# Patient Record
Sex: Female | Born: 1955 | Race: White | Hispanic: No | Smoking: Former smoker
Health system: Southern US, Community
[De-identification: ages and names within clinical notes are randomized; demographics above are authoritative.]

## PROBLEM LIST (undated history)

## (undated) DIAGNOSIS — G259 Extrapyramidal and movement disorder, unspecified: Secondary | ICD-10-CM

## (undated) DIAGNOSIS — H539 Unspecified visual disturbance: Secondary | ICD-10-CM

## (undated) DIAGNOSIS — G35 Multiple sclerosis: Secondary | ICD-10-CM

## (undated) HISTORY — DX: Extrapyramidal and movement disorder, unspecified: G25.9

## (undated) HISTORY — PX: CHOLECYSTECTOMY, LAPAROSCOPIC: SHX56

## (undated) HISTORY — DX: Unspecified visual disturbance: H53.9

## (undated) HISTORY — PX: CERVICAL CONE BIOPSY: SUR198

## (undated) HISTORY — PX: OTHER SURGICAL HISTORY: SHX169

## (undated) HISTORY — DX: Multiple sclerosis: G35

---

## 2013-07-07 DIAGNOSIS — F339 Major depressive disorder, recurrent, unspecified: Secondary | ICD-10-CM | POA: Insufficient documentation

## 2014-05-01 ENCOUNTER — Ambulatory Visit (INDEPENDENT_AMBULATORY_CARE_PROVIDER_SITE_OTHER): Payer: Medicare Other | Admitting: Neurology

## 2014-05-01 ENCOUNTER — Encounter: Payer: Self-pay | Admitting: Neurology

## 2014-05-01 VITALS — BP 134/80 | HR 76 | Resp 14 | Ht 63.0 in | Wt 193.0 lb

## 2014-05-01 DIAGNOSIS — N3941 Urge incontinence: Secondary | ICD-10-CM | POA: Diagnosis not present

## 2014-05-01 DIAGNOSIS — R5383 Other fatigue: Secondary | ICD-10-CM

## 2014-05-01 DIAGNOSIS — E559 Vitamin D deficiency, unspecified: Secondary | ICD-10-CM | POA: Insufficient documentation

## 2014-05-01 DIAGNOSIS — Z79899 Other long term (current) drug therapy: Secondary | ICD-10-CM | POA: Diagnosis not present

## 2014-05-01 DIAGNOSIS — R208 Other disturbances of skin sensation: Secondary | ICD-10-CM

## 2014-05-01 DIAGNOSIS — F418 Other specified anxiety disorders: Secondary | ICD-10-CM | POA: Diagnosis not present

## 2014-05-01 DIAGNOSIS — R269 Unspecified abnormalities of gait and mobility: Secondary | ICD-10-CM | POA: Diagnosis not present

## 2014-05-01 DIAGNOSIS — G35 Multiple sclerosis: Secondary | ICD-10-CM | POA: Diagnosis not present

## 2014-05-01 DIAGNOSIS — R32 Unspecified urinary incontinence: Secondary | ICD-10-CM | POA: Insufficient documentation

## 2014-05-01 DIAGNOSIS — G4719 Other hypersomnia: Secondary | ICD-10-CM | POA: Diagnosis not present

## 2014-05-01 MED ORDER — PHENTERMINE HCL 37.5 MG PO CAPS: 37.5000 mg | ORAL_CAPSULE | ORAL | Status: DC

## 2014-05-01 MED ORDER — AMANTADINE HCL 100 MG PO CAPS: 100.0000 mg | ORAL_CAPSULE | Freq: Two times a day (BID) | ORAL | Status: DC

## 2014-05-01 MED ORDER — CLONAZEPAM 0.5 MG PO TABS: 0.5000 mg | ORAL_TABLET | Freq: Two times a day (BID) | ORAL | Status: DC | PRN

## 2014-05-01 MED ORDER — PAROXETINE HCL 40 MG PO TABS: 40.0000 mg | ORAL_TABLET | ORAL | Status: DC

## 2014-05-01 MED ORDER — PREGABALIN 150 MG PO CAPS: 150.0000 mg | ORAL_CAPSULE | Freq: Two times a day (BID) | ORAL | Status: DC

## 2014-05-01 NOTE — Progress Notes (Signed)
[facility]  PATIENT: Jill Rogers DOB: 06-May-1955  REFERRING DOCTOR OR PCP:  None SOURCE: patient  _________________________________   HISTORICAL  CHIEF COMPLAINT:  Chief Complaint  Patient presents with  . Multiple Sclerosis    Sts. she continues to take Rebif--sts. inj. sites are taking longer to heal.  She ran out of Amantadine, so fatigue is worse.  She fell asleep sitting in a chair last week, had numbness in both legs when she woke up--sts. numbness in left leg resolved quickly, but she still has some numbness in left leg./fim    HISTORY OF PRESENT ILLNESS:  Jill Rogers is a 59 year old woman with multiple sclerosis diagnosed in 1983 after she presented with right hand weakness and a limp.   She had an MRI consistent with MS and she did not need to have a LP.    Those symptoms improved after a few weeks.     She was initially placed on Symmetrel for her fatigue and received some steroids (oral or IV) several times.    She was started on Avonex in 1994.   She switched to Rebif 5 years ago after an exacerbation treated with IV solu-medrol.    Also during that time, her husband left and she was under a lot of stress.    She started seeing me a couple years ago.    She had an episode with poor gait (and falls) and weakness in her legs 2 years ago, treated with Solu-Medrol and then started seeing me.    She tolerates Rebif well but has had some bruising at times.   Most shots just have redness x 1 week but the past month, many are bruising.   She has talked ot the Rebif nurse in the past.   She self-injects rather than using auto-injector.        On Wednesday, while sitting in front of the TV, she fell asleep.  She woke up and her legs felt numb.    Her left leg quickly improved but the right leg would not move.   She had to pick her leg up.  She fell asleep again and she was stronger but not baseline and she has a mild tingling in the leg and it doe snot feel right,  still.  She doesn't feel she can drive a long distance right now.     She also has tingling in the left side of her face and the right forehead.     Gait/Strength/sensation:   She continues to have a mild gait disorder, with difficulty with balance more than strength.  She falls now and then, twice the past month.    She feels her right leg is worse with mild weakness and some spasticity.   Sensory problems in the right leg are more recent, starting last week with tingling in the thigh (inside) currently.      Vision:   She denies any definite MS related vision problem but has a lot of difficulty with depth perception.   She wears glasses.    She has not had optic neuritis but did have a one day episode of diplopia last month.   She denies eye pain.      Bladder:   She has urinary frequency and urgency and has had incontinence 3 times this month.   She reports mild hesitancy at times and feels she does not completely empty.     Fatigue/Sleep:   She notes fatigue and sleepiness.  Fatigue is daily and worsens as day goes on.  Fatigue is physical more than cognitive but mental fatigue bothers her at times.  Heat and fever worsen her energy.     She feels better after a nap.     Sleepiness worsened the last 1 or 2 years.   We have done a sleep study and she does not have OSA.    She gets a benefit from Amantadine and a further benefit from Dexedrine 10 mg that she takes only if she is going to be out of the house a while.      Mood/Cognition:    She notes mood is variable and she has some depression and anxiety.   Many days she is optimistic bu't other days she feels nothing will go right.    Anxiety is often an issue for her.    She is on Paxil, Venlafaxine daily and clonazepam 0.5 mg as needed.   We discussed simplifying her med's --- venlafaxine 150 mg by itself was not effective enough.  EPWORTH SLEEPINESS SCALE  On a scale of 0 - 3 what is the chance of dozing:  Sitting and Reading:   3 Watching  TV:    3 Sitting inactive in a public place: 1 Passenger in car for one hour: 3 Lying down to rest in the afternoon: 3 Sitting and talking to someone: 1 Sitting quietly after lunch:  3 In a car, stopped in traffic:  1  Total (out of 24):    18/24   REVIEW OF SYSTEMS: Constitutional: No fevers, chills, sweats, or change in appetite.   Fatigue.   Excessive sleepines Eyes: No visual changes, double vision, eye pain Ear, nose and throat: No hearing loss, ear pain, nasal congestion, sore throat Cardiovascular: No chest pain, palpitations Respiratory: No shortness of breath at rest or with exertion.   No wheezes GastrointestinaI: No nausea, vomiting, diarrhea, abdominal pain, fecal incontinence Genitourinary:see above Musculoskeletal: Mild neck pain, back pain Integumentary: No rash, pruritus, skin lesions Neurological: as above Psychiatric: see above Endocrine: No palpitations, diaphoresis, change in appetite, change in weigh or increased thirst Hematologic/Lymphatic: No anemia, petechiae.   Bruises easily at injeciton sites Allergic/Immunologic: No itchy/runny eyes, nasal congestion, recent allergic reactions, rashes  ALLERGIES: Allergies  Allergen Reactions  . Hydrocodone Nausea And Vomiting  . Oxycodone Nausea And Vomiting  . Tape     bandaids rip her skin/fim    HOME MEDICATIONS:  Current outpatient prescriptions:  .  acidophilus (RISAQUAD) CAPS capsule, Take 1 capsule by mouth daily., Disp: , Rfl:  .  amantadine (SYMMETREL) 100 MG capsule, Take 100 mg by mouth 2 (two) times daily., Disp: , Rfl:  .  aspirin 81 MG tablet, Take 81 mg by mouth daily., Disp: , Rfl:  .  B Complex-Folic Acid (SUPER B COMPLEX MAXI PO), Take by mouth., Disp: , Rfl:  .  Biotin 5000 MCG CAPS, Take 5,000 mcg by mouth 2 (two) times daily at 8 am and 10 pm., Disp: , Rfl:  .  Chromium 1000 MCG TABS, Take 1,000 tablets by mouth., Disp: , Rfl:  .  clonazePAM (KLONOPIN) 0.5 MG tablet, Take 0.5 mg by  mouth 2 (two) times daily as needed for anxiety., Disp: , Rfl:  .  co-enzyme Q-10 30 MG capsule, Take 30 mg by mouth 3 (three) times daily., Disp: , Rfl:  .  dextroamphetamine (DEXTROSTAT) 10 MG tablet, Take 10 mg by mouth daily. Take one to two tablets once daily as needed.,  Disp: , Rfl:  .  interferon beta-1a (REBIF) 44 MCG/0.5ML SOSY injection, Inject 44 mcg into the skin 3 (three) times a week., Disp: , Rfl:  .  Iron-Vitamins (GERITOL COMPLETE) TABS, Take by mouth daily., Disp: , Rfl:  .  PARoxetine (PAXIL) 20 MG tablet, Take 20 mg by mouth daily., Disp: , Rfl:  .  pregabalin (LYRICA) 150 MG capsule, Take 150 mg by mouth 2 (two) times daily., Disp: , Rfl:  .  venlafaxine XR (EFFEXOR-XR) 150 MG 24 hr capsule, Take 150 mg by mouth daily with breakfast., Disp: , Rfl:  .  vitamin C (ASCORBIC ACID) 500 MG tablet, Take 500 mg by mouth daily., Disp: , Rfl:  .  Vitamin D, Cholecalciferol, 1000 UNITS CAPS, Take 2,000 Units by mouth daily., Disp: , Rfl:   PAST MEDICAL HISTORY: Past Medical History  Diagnosis Date  . Multiple sclerosis   . Movement disorder   . Vision abnormalities     PAST SURGICAL HISTORY: Past Surgical History  Procedure Laterality Date  . Cholecystectomy, laparoscopic    . Cervical cone biopsy      FAMILY HISTORY: Family History  Problem Relation Age of Onset  . Diabetes type II Mother   . Arthritis Mother   . Heart failure Father   . Diabetes type II Father     SOCIAL HISTORY:  History   Social History  . Marital Status: Divorced    Spouse Name: N/A  . Number of Children: N/A  . Years of Education: N/A   Occupational History  . Not on file.   Social History Main Topics  . Smoking status: Current Every Day Smoker -- 0.50 packs/day    Types: Cigarettes  . Smokeless tobacco: Not on file  . Alcohol Use: 0.0 oz/week    0 Standard drinks or equivalent per week     Comment: occasional/fim  . Drug Use: No  . Sexual Activity: Not on file   Other Topics  Concern  . Not on file   Social History Narrative  . No narrative on file     PHYSICAL EXAM  Filed Vitals:   05/01/14 1040  BP: 134/80  Pulse: 76  Resp: 14  Height:  (1.6 m)  Weight: 193 lb (87.544 kg)    Body mass index is 34.2 kg/(m^2).   General: The patient is well-developed and well-nourished and in no acute distress  Eyes:  Funduscopic exam shows normal optic discs and retinal vessels.  Neck: The neck is supple, no carotid bruits are noted.  The neck is nontender.  Cardiovascular: The heart has a regular rate and rhythm with a normal S1 and S2. There were no murmurs, gallops or rubs. Lungs are clear to auscultation.  Skin: Extremities are without significant edema.  Musculoskeletal:  Back is nontender  Neurologic Exam  Mental status: The patient is alert and oriented x 3 at the time of the examination. The patient has apparent normal recent and remote memory, with an apparently normal attention span and concentration ability.   Speech is normal.  Cranial nerves: Extraocular movements are full. Pupils are equal, round, and reactive to light and accomodation.  Visual fields are full.  Facial symmetry is present. There is good facial sensation to soft touch bilaterally.Facial strength is normal.  Trapezius and sternocleidomastoid strength is normal. No dysarthria is noted.  The tongue is midline, and the patient has symmetric elevation of the soft palate. No obvious hearing deficits are noted.  Motor:  Muscle bulk  is normal.   Tone is slightly increased in right leg. Strength is  5 / 5 in all 4 extremities.   Sensory: Sensory testing is intact to pinprick, soft touch and vibration sensation in all 4 extremities in arms but decreased touch/scratch in right anterolateral thigh  Coordination: Cerebellar testing reveals good finger-nose-finger and poor right heel-to-shin .  Gait and station: Station is normal.   Gait is wide. She cannot tandem walk. Romberg is  negative.   Reflexes: Deep tendon reflexes are increased in right leg.   Plantar responses are flexor.    DIAGNOSTIC DATA (LABS, IMAGING, TESTING) - I reviewed patient records, labs, notes, testing and imaging myself where available.     ASSESSMENT AND PLAN  Multiple sclerosis - Plan: MR Brain W Wo Contrast, CBC with Differential/Platelet, Comprehensive metabolic panel  Other fatigue  Depression with anxiety  Dysesthesia  Vitamin D deficiency  High risk medication use - Plan: CBC with Differential/Platelet, Comprehensive metabolic panel  Excessive daytime sleepiness  Gait disturbance  Urge incontinence of urine   In summary, Jill Rogers is a 59 year old woman with multiple sclerosis who currently appears to be stable on Rebif therapy. However, she is having some injection site issues with bruising and erythema. She has multiple symptoms related to the MS including fatigue, gait disturbance, urinary incontinence and dysesthesias. She also has depression and anxiety.   We had a long discussion about symptoms management.    We will continue amantadine for fatigue and add phentermine as it may help her fatigue and also help weight loss. She will continue Lyrica for dysesthesias and this was refilled. Change her depression medications by increasing the Paxil and discontinuing the venlafaxine hopefully this simpler regimen will work for her. She will continue Klonopin twice a day as needed. Refills and scripts were provided. If the urinary issues continue to be a problem, I would consider adding a cholinergic agent for her bladder.   I will check a CBC and CMP to make sure that she is not developing any leukopenia or hepatotoxicity from her Rebif therapy. Additionally, an MRI of the brain with and without contrast will be performed to determine if she is having subclinical progression. If present, I would recommend that we switch from Rebif to another agent.  She will return to see me  in 4-6 months or sooner if she has new or worsening neurologic symptoms.  45 minute face-to-face interaction with greater than 50% of the time counseling and coordinating care about her MS, mood and related symptoms.  Orton Capell A. Epimenio Foot, MD, PhD 05/01/2014, 11:00 AM Certified in Neurology, Clinical Neurophysiology, Sleep Medicine, Pain Medicine and Neuroimaging  [facility] [address] [place], [place] [address] [phone]

## 2014-05-02 ENCOUNTER — Telehealth: Payer: Self-pay | Admitting: Neurology

## 2014-05-02 LAB — COMPREHENSIVE METABOLIC PANEL
ALT: 26 IU/L (ref 0–32)
AST: 21 IU/L (ref 0–40)
Albumin/Globulin Ratio: 1.3 (ref 1.1–2.5)
Albumin: 4 g/dL (ref 3.5–5.5)
Alkaline Phosphatase: 71 IU/L (ref 39–117)
BUN/Creatinine Ratio: 24 — ABNORMAL HIGH (ref 9–23)
BUN: 15 mg/dL (ref 6–24)
Bilirubin Total: 0.2 mg/dL (ref 0.0–1.2)
CO2: 24 mmol/L (ref 18–29)
Calcium: 9.8 mg/dL (ref 8.7–10.2)
Chloride: 104 mmol/L (ref 97–108)
Creatinine, Ser: 0.62 mg/dL (ref 0.57–1.00)
GFR calc Af Amer: 117 mL/min/{1.73_m2} (ref >59)
GFR calc non Af Amer: 101 mL/min/{1.73_m2} (ref >59)
Globulin, Total: 3 g/dL (ref 1.5–4.5)
Glucose: 85 mg/dL (ref 65–99)
POTASSIUM: 5.4 mmol/L — AB (ref 3.5–5.2)
SODIUM: 144 mmol/L (ref 134–144)
Total Protein: 7 g/dL (ref 6.0–8.5)

## 2014-05-02 LAB — CBC WITH DIFFERENTIAL/PLATELET
Basophils Absolute: 0 10*3/uL (ref 0.0–0.2)
Basos: 0 %
Eos: 1 %
Eosinophils Absolute: 0.1 10*3/uL (ref 0.0–0.4)
HCT: 39.7 % (ref 34.0–46.6)
Hemoglobin: 13.1 g/dL (ref 11.1–15.9)
Immature Grans (Abs): 0 10*3/uL (ref 0.0–0.1)
Immature Granulocytes: 0 %
LYMPHS ABS: 4 10*3/uL — AB (ref 0.7–3.1)
Lymphs: 45 %
MCH: 30.3 pg (ref 26.6–33.0)
MCHC: 33 g/dL (ref 31.5–35.7)
MCV: 92 fL (ref 79–97)
Monocytes Absolute: 0.5 10*3/uL (ref 0.1–0.9)
Monocytes: 5 %
Neutrophils Absolute: 4.3 10*3/uL (ref 1.4–7.0)
Neutrophils Relative %: 49 %
PLATELETS: 191 10*3/uL (ref 150–379)
RBC: 4.32 x10E6/uL (ref 3.77–5.28)
RDW: 14.6 % (ref 12.3–15.4)
WBC: 8.9 10*3/uL (ref 3.4–10.8)

## 2014-05-02 NOTE — Telephone Encounter (Signed)
[facility] stated someone from the office need to call to answer the questions regarding PA.  Then it will be denied and we could appeal at that time, per patient.  Please call [facility] @ [phone].

## 2014-05-02 NOTE — Telephone Encounter (Signed)
Patient is calling in regard to one of her new Rx Phentermine 33.5 mg capsules 1 time per day.  Her insurance [employer] does not include Rx in their formulary.  Patient needs either pre-authorization @800 [phone] or a different Rx faxed to [phone] or phone #[phone].  Please call patient.

## 2014-05-02 NOTE — Telephone Encounter (Signed)
Spoke with Jill Rogers and explained that once she takes rx. to the pharmacy, the pharmacy will fax [place] a pa form if one is required--she verbalized understanding of same/fim

## 2014-05-02 NOTE — Telephone Encounter (Signed)
error 

## 2014-05-02 NOTE — Telephone Encounter (Signed)
Ins has been contacted and provided with clinical info.  Request is under review.  Ref Key: [id number]

## 2014-05-06 ENCOUNTER — Ambulatory Visit (HOSPITAL_BASED_OUTPATIENT_CLINIC_OR_DEPARTMENT_OTHER)
Admission: RE | Admit: 2014-05-06 | Discharge: 2014-05-06 | Disposition: A | Discharge status: Home / Self Care | Payer: Medicare Other | Source: Ambulatory Visit | Attending: Neurology | Admitting: Neurology

## 2014-05-06 DIAGNOSIS — R200 Anesthesia of skin: Secondary | ICD-10-CM | POA: Insufficient documentation

## 2014-05-06 DIAGNOSIS — G35 Multiple sclerosis: Secondary | ICD-10-CM | POA: Insufficient documentation

## 2014-05-06 DIAGNOSIS — R270 Ataxia, unspecified: Secondary | ICD-10-CM | POA: Diagnosis not present

## 2014-05-06 MED ORDER — GADOBENATE DIMEGLUMINE 529 MG/ML IV SOLN: 18.0000 mL | Freq: Once | INTRAVENOUS | Status: AC | PRN

## 2014-05-08 ENCOUNTER — Telehealth: Payer: Self-pay

## 2014-05-08 NOTE — Telephone Encounter (Signed)
[facility]) sent a letter saying they are unable to approve the request for coverage on Phentermine, as it is excluded from Medicare coverage by law (under section 1860D-2 (e) (2) of the social security act) and the drug is not offered as a supplemental benefit either.  In order for the patient to obtain medication, they will need to pay out of pocket.  I called the patient to advise.  Got no answer.  Left message.  Recommended she go to [email or web address] to print coupon.  Asked her to call us back if needed.

## 2014-05-08 NOTE — Telephone Encounter (Signed)
-----   Message from Asa Lente, MD sent at 05/08/2014  4:45 PM EDT ----- Please let her know MRI shows no new MS plaques.     Mild sinusitis on the right.   If any recent fevers, let me know and I'll call in an Abx.

## 2014-05-08 NOTE — Telephone Encounter (Signed)
Spoke with Jill Rogers and per RAS, advised no new ms lesions on mri, but she does have mild right sinusitis.  [name] sts. did have more congestion, some fever a few weeks ago, but this is improving./fim

## 2014-05-09 NOTE — Telephone Encounter (Signed)
Patient received message and has questions.  Please return call to [phone].

## 2014-05-09 NOTE — Telephone Encounter (Signed)
I called back.  Got no answer.  Left message.  

## 2014-08-31 ENCOUNTER — Encounter: Payer: Self-pay | Admitting: Neurology

## 2014-08-31 ENCOUNTER — Ambulatory Visit (INDEPENDENT_AMBULATORY_CARE_PROVIDER_SITE_OTHER): Payer: Medicare Other | Admitting: Neurology

## 2014-08-31 VITALS — BP 136/88 | HR 80 | Resp 16 | Ht 63.0 in | Wt 188.8 lb

## 2014-08-31 DIAGNOSIS — R208 Other disturbances of skin sensation: Secondary | ICD-10-CM

## 2014-08-31 DIAGNOSIS — R269 Unspecified abnormalities of gait and mobility: Secondary | ICD-10-CM | POA: Diagnosis not present

## 2014-08-31 DIAGNOSIS — N3946 Mixed incontinence: Secondary | ICD-10-CM | POA: Diagnosis not present

## 2014-08-31 DIAGNOSIS — G35 Multiple sclerosis: Secondary | ICD-10-CM

## 2014-08-31 DIAGNOSIS — F418 Other specified anxiety disorders: Secondary | ICD-10-CM | POA: Diagnosis not present

## 2014-08-31 DIAGNOSIS — Z79899 Other long term (current) drug therapy: Secondary | ICD-10-CM | POA: Diagnosis not present

## 2014-08-31 DIAGNOSIS — G4719 Other hypersomnia: Secondary | ICD-10-CM | POA: Diagnosis not present

## 2014-08-31 MED ORDER — PREGABALIN 150 MG PO CAPS: 150.0000 mg | ORAL_CAPSULE | Freq: Two times a day (BID) | ORAL | Status: DC

## 2014-08-31 MED ORDER — CLONAZEPAM 0.5 MG PO TABS: 0.5000 mg | ORAL_TABLET | Freq: Two times a day (BID) | ORAL | Status: DC | PRN

## 2014-08-31 MED ORDER — PHENTERMINE HCL 37.5 MG PO CAPS: 37.5000 mg | ORAL_CAPSULE | ORAL | Status: DC

## 2014-08-31 NOTE — Progress Notes (Signed)
[facility]  PATIENT: Jill Rogers DOB: 1955/04/17  REFERRING DOCTOR OR PCP:  None SOURCE: patient  _________________________________   HISTORICAL  CHIEF COMPLAINT:  Chief Complaint  Patient presents with  . Multiple Sclerosis    Sts. she continues to tolerate Rebif well.  Continues to have some inj. sites that take longer to heal.  Sts. fatigue is better since restarting Amantadine and adding Phentermine.  Sts. depression is improved since stopping Venlafaxine and increasing Paxil.  Sts. twice in the last few weeks she has had brief episodes of double vision. (sev. min. each episode).  Sts. both episodes were when she was either fatigued or hot.  She had an mri brain in March that didn't show any new changes/fim    HISTORY OF PRESENT ILLNESS:  Jill Rogers is a 59 year old woman with multiple sclerosis.  She continues on Reif and is tolerating it well.    Gait/Strength/sensation:   She has a mild gait disorder, with reduced balance.  She falls about once a month.    She feels her right leg is worse with mild weakness and some spasticity.   She notes numbness in her hands when she wakes.   Numbness resolves after a few minutes if she shakes her hands.    In the past, she had NCV for similar symptoms and was told she did not have CTS.   Last visit, she had an episode of severe right leg numbness and has had one other episode that also lasted 10 minutes.  One episode (lasting 10 minutes) was associated with gardening and occurred after she came in and sat x 30 minutes. Another episode occurred after falling asleep on the couch.  Vision:   She denies any definite MS related vision problem but has needed new glasses almost every year.   She also needs reading glasses   She has not had optic neuritis or eye pain.    She has had 2 episodes of diplopia lasting 5-10 minutes.    Bladder:   She has urinary frequency and urgency and rare incontinence (due to urgency on way to  the bathroom).   She reports mild hesitancy at times and feels she does not completely empty.     Fatigue/Sleep:   She notes physical > cognitive fatigue.   He also notes sleepiness. Both are better since starting daily phentermine.   She takes a dexedrine only on days she cleans her mom's pool (alsways has fatigue doing that).    Heat and fever worsen her fatigue.     She feels sleepiness is better after a nap.    PSG showed she does not have OSA.     Mood/Cognition:    She feels more stress due to financial issues which is casuing her to get anxious more.    She is on Paxil 40 mg but still notes depression.      EPWORTH SLEEPINESS SCALE  On a scale of 0 - 3 what is the chance of dozing:  Sitting and Reading:   1 Watching TV:    2 Sitting inactive in a public place: 1 Passenger in car for one hour: 3 Lying down to rest in the afternoon: 3 Sitting and talking to someone: 1 Sitting quietly after lunch:  1 In a car, stopped in traffic:  0  Total (out of 24):    12/24 (was 18/24 last visit)   MS History:  She was diagnosed in 1983 after she presented with  right hand weakness and a limp.   She had an MRI consistent with MS and she did not need to have a LP.    Those symptoms improved after a few weeks.     She was initially placed on Symmetrel for her fatigue and received some steroids (oral or IV) several times.    She was started on Avonex in 1994.   She switched to Rebif 5 years ago after an exacerbation treated with IV solu-medrol.    Also during that time, her husband left and she was under a lot of stress.    She started seeing me around 2013.  Her last exacerbation was 2 years ago with some gait issues.   She tolerates Rebif well but has had some bruising at times.         REVIEW OF SYSTEMS: Constitutional: No fevers, chills, sweats, or change in appetite.   Fatigue.   Excessive sleepines Eyes: No visual changes, double vision, eye pain Ear, nose and throat: No hearing loss, ear pain,  nasal congestion, sore throat Cardiovascular: No chest pain, palpitations Respiratory: No shortness of breath at rest or with exertion.   No wheezes GastrointestinaI: No nausea, vomiting, diarrhea, abdominal pain, fecal incontinence Genitourinary:see above Musculoskeletal: Mild neck pain, back pain Integumentary: No rash, pruritus, skin lesions Neurological: as above Psychiatric: see above Endocrine: No palpitations, diaphoresis, change in appetite, change in weigh or increased thirst Hematologic/Lymphatic: No anemia, petechiae.   Bruises easily at injeciton sites Allergic/Immunologic: No itchy/runny eyes, nasal congestion, recent allergic reactions, rashes  ALLERGIES: Allergies  Allergen Reactions  . Hydrocodone Nausea And Vomiting  . Oxycodone Nausea And Vomiting  . Tape     bandaids rip her skin/fim    HOME MEDICATIONS:  Current outpatient prescriptions:  .  acidophilus (RISAQUAD) CAPS capsule, Take 1 capsule by mouth daily., Disp: , Rfl:  .  amantadine (SYMMETREL) 100 MG capsule, Take 1 capsule (100 mg total) by mouth 2 (two) times daily., Disp: 180 capsule, Rfl: 3 .  aspirin 81 MG tablet, Take 81 mg by mouth daily., Disp: , Rfl:  .  Biotin 5000 MCG CAPS, Take 5,000 mcg by mouth 2 (two) times daily at 8 am and 10 pm., Disp: , Rfl:  .  Chromium 1000 MCG TABS, Take 1,000 tablets by mouth., Disp: , Rfl:  .  clonazePAM (KLONOPIN) 0.5 MG tablet, Take 1 tablet (0.5 mg total) by mouth 2 (two) times daily as needed for anxiety., Disp: 60 tablet, Rfl: 5 .  co-enzyme Q-10 30 MG capsule, Take 30 mg by mouth 3 (three) times daily., Disp: , Rfl:  .  interferon beta-1a (REBIF) 44 MCG/0.5ML SOSY injection, Inject 44 mcg into the skin 3 (three) times a week., Disp: , Rfl:  .  Iron-Vitamins (GERITOL COMPLETE) TABS, Take by mouth daily., Disp: , Rfl:  .  PARoxetine (PAXIL) 40 MG tablet, Take 1 tablet (40 mg total) by mouth every morning., Disp: 90 tablet, Rfl: 3 .  phentermine 37.5 MG  capsule, Take 1 capsule (37.5 mg total) by mouth every morning., Disp: 90 capsule, Rfl: 1 .  pregabalin (LYRICA) 150 MG capsule, Take 1 capsule (150 mg total) by mouth 2 (two) times daily., Disp: 60 capsule, Rfl: 5 .  Vitamin D, Cholecalciferol, 1000 UNITS CAPS, Take 2,000 Units by mouth daily., Disp: , Rfl:  .  B Complex-Folic Acid (SUPER B COMPLEX MAXI PO), Take by mouth., Disp: , Rfl:  .  dextroamphetamine (DEXTROSTAT) 10 MG tablet, Take 10 mg by  mouth daily. Take one to two tablets once daily as needed., Disp: , Rfl:  .  vitamin C (ASCORBIC ACID) 500 MG tablet, Take 500 mg by mouth daily., Disp: , Rfl:   PAST MEDICAL HISTORY: Past Medical History  Diagnosis Date  . Multiple sclerosis   . Movement disorder   . Vision abnormalities     PAST SURGICAL HISTORY: Past Surgical History  Procedure Laterality Date  . Cholecystectomy, laparoscopic    . Cervical cone biopsy      FAMILY HISTORY: Family History  Problem Relation Age of Onset  . Diabetes type II Mother   . Arthritis Mother   . Heart failure Father   . Diabetes type II Father     SOCIAL HISTORY:  History   Social History  . Marital Status: Divorced    Spouse Name: N/A  . Number of Children: N/A  . Years of Education: N/A   Occupational History  . Not on file.   Social History Main Topics  . Smoking status: Current Every Day Smoker -- 0.50 packs/day    Types: Cigarettes  . Smokeless tobacco: Not on file  . Alcohol Use: 0.0 oz/week    0 Standard drinks or equivalent per week     Comment: occasional/fim  . Drug Use: No  . Sexual Activity: Not on file   Other Topics Concern  . Not on file   Social History Narrative     PHYSICAL EXAM  Filed Vitals:   08/31/14 1047  BP: 136/88  Pulse: 80  Resp: 16  Height: 5\' 3"  (1.6 m)  Weight: 188 lb 12.8 oz (85.639 kg)    Body mass index is 33.45 kg/(m^2).   General: The patient is well-developed and well-nourished and in no acute  distress  Cardiovascular: The heart has a regular rate and rhythm with a normal S1 and S2. There were no murmurs, gallops or rubs. .  Skin: Extremities are without significant edema.  Musculoskeletal:  Back is nontender  Neurologic Exam  Mental status: The patient is alert and oriented x 3 at the time of the examination. The patient has apparent normal recent and remote memory, with an apparently normal attention span and concentration ability.   Speech is normal.  Cranial nerves: Extraocular movements are full. Pupils are equal, round, and reactive to light and accomodation.    Facial symmetry is present. There is good facial sensation to soft touch bilaterally.Facial strength is normal.  Trapezius and sternocleidomastoid strength is normal. No dysarthria is noted.  The tongue is midline, and the patient has symmetric elevation of the soft palate. No obvious hearing deficits are noted.  Motor:  Muscle bulk is normal.   Tone is slightly increased in right leg. Strength is  5 / 5 in all 4 extremities.   Sensory: Sensory testing is intact to pinprick, soft touch and vibration sensation in all 4 extremities in arms but decreased touch/scratch in right anterolateral thigh  Coordination: Cerebellar testing reveals good finger-nose-finger and poor right heel-to-shin .  Gait and station: Station is normal.   Gait is wide. She cannot tandem walk. Romberg is negative.   Reflexes: Deep tendon reflexes are increased in right leg.       DIAGNOSTIC DATA (LABS, IMAGING, TESTING) - I reviewed patient records, labs, notes, testing and imaging myself where available.     ASSESSMENT AND PLAN  Multiple sclerosis  Dysesthesia  Depression with anxiety  Gait disturbance  Mixed incontinence  High risk medication use  Excessive  daytime sleepiness    1.   She will continue on Rebif therapy. We discussed other options if she chooses not to continue with injectable medications. 2.  She will  continue phentermine for her weight loss and fatigue. A refill was provided. 3. Lyrica 150 mg twice a day for dysesthesias 4.  Clonazepam 0.5 mg twice a day when necessary for anxiety. We'll also continue Paxil 5.  She will return to see me in 4-6 months or sooner if she has new or worsening neurologic symptoms.  45 minute face-to-face interaction with greater than 50% of the time counseling and coordinating care about her MS, mood and related symptoms.  Talin Feister A. Epimenio Foot, MD, PhD 08/31/2014, 11:00 AM Certified in Neurology, Clinical Neurophysiology, Sleep Medicine, Pain Medicine and Neuroimaging  [facility] [address] [place], [place] [address] [phone]

## 2014-11-01 ENCOUNTER — Encounter: Payer: Self-pay | Admitting: Neurology

## 2014-11-01 ENCOUNTER — Ambulatory Visit (INDEPENDENT_AMBULATORY_CARE_PROVIDER_SITE_OTHER): Payer: Medicare Other | Admitting: Neurology

## 2014-11-01 ENCOUNTER — Telehealth: Payer: Self-pay | Admitting: Neurology

## 2014-11-01 VITALS — BP 150/88 | HR 96 | Resp 16 | Ht 63.0 in | Wt 191.0 lb

## 2014-11-01 DIAGNOSIS — R51 Headache: Secondary | ICD-10-CM | POA: Diagnosis not present

## 2014-11-01 DIAGNOSIS — G35 Multiple sclerosis: Secondary | ICD-10-CM

## 2014-11-01 DIAGNOSIS — I10 Essential (primary) hypertension: Secondary | ICD-10-CM | POA: Diagnosis not present

## 2014-11-01 DIAGNOSIS — R519 Headache, unspecified: Secondary | ICD-10-CM | POA: Insufficient documentation

## 2014-11-01 MED ORDER — OXYCODONE HCL 5 MG PO TABS: 5.0000 mg | ORAL_TABLET | Freq: Four times a day (QID) | ORAL | Status: DC | PRN

## 2014-11-01 MED ORDER — INDOMETHACIN 25 MG PO CAPS: ORAL_CAPSULE | ORAL | Status: DC

## 2014-11-01 NOTE — Progress Notes (Signed)
[facility]  PATIENT: Jill Rogers DOB: [birth year]     HISTORICAL  CHIEF COMPLAINT:  Chief Complaint  Patient presents with  . Multiple Sclerosis    Sts. hx. of migraines yrs. ago, but h/a's tapered off after menopause.  Sts. on 10-26-14, she was having a bm, had sudden onset of h/a, not typical for migraines that she used to help.  H/A localizes to right temporal region and worsens when she bends over.  She called 911, was taken to [facility] ED.  Sts. CT head was negative and she was d/c to f/u with Dr. Epimenio Foot.  Sts. h/a is much improved but still present./fim  . Headache    HISTORY OF PRESENT ILLNESS:  While having a BM yesterday, she had the sudden onset of a severe headache. She was not straining when the HA started.    She had no weakness or numbness, visual changes or speech difficulty.     She had mild nausea but no vomiting. She felt there was mild photophobia and phonophobia  She has a FH of aneurysms and strokes so she called 911 and went to [facility].   She had CT and CTA of neck and head arteries ---  There was no evidence of bleeds and no evidence of aneurysm.  In the hospital, BP was elevated at first but was 150/80- at discharge.     Compared to yesterday, her headache is better but still moderate.   It is worse on the right.     She is getting Excedrin Migraine, Tylenol and clonazepam.  Moving or raising her voice is increasing the pain.   Walking around increases the pain.   HA is a little better when she is not moving around laying down.   She is able to sleep well.  She has a history of migraines but has never had pain as intense as this one or with as sudden onset.   Migraines improved with menopause  I personally reviewed CT of the head and CTA's.    No acute findings, no aneurysms identified.  She feels her MS has been stable.   No new symptoms.   Gait stable,  uses cane.  She is on Rebif and tolerating it well  ROS:  Out of a complete 14 system  review of symptoms, the patient complains only of the following symptoms, and all other reviewed systems are negative.  She notes fatigue. She has noted some depression and anxiety but feels this is stable.   ALLERGIES: Allergies  Allergen Reactions  . Hydrocodone Nausea And Vomiting  . Oxycodone Nausea And Vomiting  . Tape     bandaids rip her skin/fim    HOME MEDICATIONS:  Current outpatient prescriptions:  .  acidophilus (RISAQUAD) CAPS capsule, Take 1 capsule by mouth daily., Disp: , Rfl:  .  amantadine (SYMMETREL) 100 MG capsule, Take 1 capsule (100 mg total) by mouth 2 (two) times daily., Disp: 180 capsule, Rfl: 3 .  aspirin 81 MG tablet, Take 81 mg by mouth daily., Disp: , Rfl:  .  B Complex-Folic Acid (SUPER B COMPLEX MAXI PO), Take by mouth., Disp: , Rfl:  .  Biotin 5000 MCG CAPS, Take 5,000 mcg by mouth 2 (two) times daily at 8 am and 10 pm., Disp: , Rfl:  .  Chromium 1000 MCG TABS, Take 1,000 tablets by mouth., Disp: , Rfl:  .  clonazePAM (KLONOPIN) 0.5 MG tablet, Take 1 tablet (0.5 mg total) by mouth 2 (two) times  daily as needed for anxiety., Disp: 60 tablet, Rfl: 4 .  co-enzyme Q-10 30 MG capsule, Take 30 mg by mouth 3 (three) times daily., Disp: , Rfl:  .  dextroamphetamine (DEXTROSTAT) 10 MG tablet, Take 10 mg by mouth daily. Take one to two tablets once daily as needed., Disp: , Rfl:  .  hydrochlorothiazide (HYDRODIURIL) 25 MG tablet, Take 25 mg by mouth daily., Disp: , Rfl:  .  interferon beta-1a (REBIF) 44 MCG/0.5ML SOSY injection, Inject 44 mcg into the skin 3 (three) times a week., Disp: , Rfl:  .  Iron-Vitamins (GERITOL COMPLETE) TABS, Take by mouth daily., Disp: , Rfl:  .  PARoxetine (PAXIL) 40 MG tablet, Take 1 tablet (40 mg total) by mouth every morning., Disp: 90 tablet, Rfl: 3 .  pregabalin (LYRICA) 150 MG capsule, Take 1 capsule (150 mg total) by mouth 2 (two) times daily., Disp: 60 capsule, Rfl: 5 .  vitamin C (ASCORBIC ACID) 500 MG tablet, Take 500 mg by  mouth daily., Disp: , Rfl:  .  Vitamin D, Cholecalciferol, 1000 UNITS CAPS, Take 2,000 Units by mouth daily., Disp: , Rfl:  .  phentermine 37.5 MG capsule, Take 1 capsule (37.5 mg total) by mouth every morning., Disp: 90 capsule, Rfl: 1  PAST MEDICAL HISTORY: Past Medical History  Diagnosis Date  . Multiple sclerosis   . Movement disorder   . Vision abnormalities     PAST SURGICAL HISTORY: Past Surgical History  Procedure Laterality Date  . Cholecystectomy, laparoscopic    . Cervical cone biopsy      FAMILY HISTORY: Family History  Problem Relation Age of Onset  . Diabetes type II Mother   . Arthritis Mother   . Heart failure Father   . Diabetes type II Father     SOCIAL HISTORY:  Social History   Social History  . Marital Status: Divorced    Spouse Name: N/A  . Number of Children: N/A  . Years of Education: N/A   Occupational History  . Not on file.   Social History Main Topics  . Smoking status: Current Every Day Smoker -- 0.50 packs/day    Types: Cigarettes  . Smokeless tobacco: Not on file  . Alcohol Use: 0.0 oz/week    0 Standard drinks or equivalent per week     Comment: occasional/fim  . Drug Use: No  . Sexual Activity: Not on file   Other Topics Concern  . Not on file   Social History Narrative     PHYSICAL EXAM  Filed Vitals:   11/01/14 1643  BP: 150/88  Pulse: 96  Resp: 16  Height:  (1.6 m)  Weight: 191 lb (86.637 kg)    Body mass index is 33.84 kg/(m^2).   General: The patient is well-developed and well-nourished and in no acute distress  Eyes:  Funduscopic exam shows normal optic discs and retinal vessels.  Neck: The neck is supple, no carotid bruits are noted.  The neck is nontender.  Skin: Extremities show some bruisingg from ER in arms.  Musculoskeletal:  Back is nontender  Neurologic Exam  Mental status: The patient is alert and oriented x 3 at the time of the examination. The patient has apparent normal recent  and remote memory, with an apparently normal attention span and concentration ability.   Speech is normal.  Cranial nerves: Extraocular movements are full. Pupils are equal, round, and reactive to light and accomodation.    Facial symmetry is present. There is good facial  sensation to soft touch bilaterally.Facial strength is normal.  Trapezius and sternocleidomastoid strength is normal. No dysarthria is noted.  No obvious hearing deficits are noted.  Motor:  Muscle bulk is normal.   Tone is increased in right leg. Strength is  5 / 5 in all 4 extremities.   Sensory: Sensory testing is intact to soft touch in all 4 extremities.  Coordination: Cerebellar testing reveals good finger-nose-finger  bilaterally.  Gait and station: Station is normal.   Gait is wide. She cannot Tandem walk. Romberg is negative.   Reflexes: Deep tendon reflexes are increased in right leg.     DIAGNOSTIC DATA (LABS, IMAGING, TESTING) - I reviewed patient records, labs, notes, testing and imaging myself where available.  Lab Results  Component Value Date   WBC 8.9 05/01/2014   HGB 13.1 05/01/2014   HCT 39.7 05/01/2014   MCV 92 05/01/2014   PLT 191 05/01/2014      Component Value Date/Time   NA 144 05/01/2014 1152   K 5.4* 05/01/2014 1152   CL 104 05/01/2014 1152   CO2 24 05/01/2014 1152   GLUCOSE 85 05/01/2014 1152   BUN 15 05/01/2014 1152   CREATININE 0.62 05/01/2014 1152   CALCIUM 9.8 05/01/2014 1152   PROT 7.0 05/01/2014 1152   AST 21 05/01/2014 1152   ALT 26 05/01/2014 1152   ALKPHOS 71 05/01/2014 1152   BILITOT 0.2 05/01/2014 1152   GFRNONAA 101 05/01/2014 1152   GFRAA 117 05/01/2014 1152       ASSESSMENT AND PLAN  Sudden onset of severe headache  Multiple sclerosis  Essential hypertension   1.   Toradol IM 60 mg x 1 2.    Indomethacin 25 mg po tid prn with food and oxycodone prn 3.    If not continuing to improve, consider adding a tricyclic or calcium channel blocker --- she  will call back in 1-2 days if not better rtc as scheduled in Dece[place] or call if any problems   Avonelle Viveros A. Epimenio Foot, MD, PhD 11/01/2014, 4:59 PM Certified in Neurology, Clinical Neurophysiology, Sleep Medicine, Pain Medicine and Neuroimaging  [facility] [address] [place], [place] [address] [phone]

## 2014-11-01 NOTE — Telephone Encounter (Signed)
patient called and said she went to [facility] with a very bad headache and she needs to see Dr Epimenio Foot ASAP please call pt at her mobile number dg

## 2014-11-01 NOTE — Telephone Encounter (Signed)
I have spoken with Jill Rogers this afternoon and given appt. for 1620 today, for eval of h/a's/fim

## 2014-11-03 ENCOUNTER — Telehealth: Payer: Self-pay | Admitting: Neurology

## 2014-11-03 NOTE — Telephone Encounter (Signed)
I have spoken with Jill Rogers this morning.  She sts. h/a is much better and she sees no need to take Oxycodone.  I have advised that per RAS, he will not add other meds for h/a at this time/fim

## 2014-11-03 NOTE — Telephone Encounter (Signed)
Patient called to advise she is feeling better however Dr. Epimenio Foot told her at visit Wednesday 9/21 that if she wasn't 90% better to call back. Feels 75-80% better.

## 2015-02-02 ENCOUNTER — Other Ambulatory Visit: Payer: Self-pay

## 2015-02-02 MED ORDER — INTERFERON BETA-1A 44 MCG/0.5ML ~~LOC~~ SOSY: 44.0000 ug | PREFILLED_SYRINGE | SUBCUTANEOUS | Status: DC

## 2015-02-08 ENCOUNTER — Encounter: Payer: Self-pay | Admitting: Neurology

## 2015-02-08 ENCOUNTER — Ambulatory Visit (INDEPENDENT_AMBULATORY_CARE_PROVIDER_SITE_OTHER): Payer: Medicare Other | Admitting: Neurology

## 2015-02-08 VITALS — BP 101/70 | HR 86 | Ht 63.0 in | Wt 192.2 lb

## 2015-02-08 DIAGNOSIS — R208 Other disturbances of skin sensation: Secondary | ICD-10-CM | POA: Diagnosis not present

## 2015-02-08 DIAGNOSIS — R5383 Other fatigue: Secondary | ICD-10-CM | POA: Diagnosis not present

## 2015-02-08 DIAGNOSIS — N3946 Mixed incontinence: Secondary | ICD-10-CM | POA: Diagnosis not present

## 2015-02-08 DIAGNOSIS — F418 Other specified anxiety disorders: Secondary | ICD-10-CM | POA: Diagnosis not present

## 2015-02-08 DIAGNOSIS — G35 Multiple sclerosis: Secondary | ICD-10-CM

## 2015-02-08 DIAGNOSIS — Z79899 Other long term (current) drug therapy: Secondary | ICD-10-CM | POA: Diagnosis not present

## 2015-02-08 DIAGNOSIS — R269 Unspecified abnormalities of gait and mobility: Secondary | ICD-10-CM

## 2015-02-08 MED ORDER — PHENTERMINE HCL 37.5 MG PO CAPS: 37.5000 mg | ORAL_CAPSULE | ORAL | Status: DC

## 2015-02-08 MED ORDER — AMANTADINE HCL 100 MG PO CAPS: 100.0000 mg | ORAL_CAPSULE | Freq: Two times a day (BID) | ORAL | Status: DC

## 2015-02-08 MED ORDER — PAROXETINE HCL 40 MG PO TABS: 40.0000 mg | ORAL_TABLET | ORAL | Status: DC

## 2015-02-08 MED ORDER — CLONAZEPAM 0.5 MG PO TABS: 0.5000 mg | ORAL_TABLET | Freq: Two times a day (BID) | ORAL | Status: DC | PRN

## 2015-02-08 MED ORDER — PREGABALIN 150 MG PO CAPS: 150.0000 mg | ORAL_CAPSULE | Freq: Two times a day (BID) | ORAL | Status: DC

## 2015-02-08 NOTE — Progress Notes (Signed)
[facility]  PATIENT: Jill Rogers DOB: 1955/10/20  REFERRING DOCTOR OR PCP:  None SOURCE: patient  _________________________________   HISTORICAL  CHIEF COMPLAINT:  Chief Complaint  Patient presents with  . Follow-up    MS follow up    HISTORY OF PRESENT ILLNESS:  Jill Rogers is a 59 year old woman with multiple sclerosis.  She is on Rebif and is tolerating it well.     She denies any recent exacerbation.     Gait/Strength/sensation:   She has reduced balance and tires out easily with walking.    She falls about once a month.    Her right leg has mild weakness and some spasticity.   No arm weakness or spasticity.   However, she has numbness in her hands when she wakes.   Numbness resolves after a few minutes if she shakes her hands.    In the past, she had NCV for similar symptoms and was told she did not have CTS.   Marland Kitchen  Vision:   She has some diplopia while driving or looking at TV, especially if tired.      She also needs reading glasses   She has not had optic neuritis or eye pain.       Bladder:   She has urinary frequency and urgency and rare incontinence if she can't get to the bathroom rapidly.   She reports mild hesitancy at times and feels she does not completely empty.     Fatigue/Sleep:   She notes physical > cognitive fatigue.   She has mild sleepiness and occasionally falls asleep on her couch. Both are better since starting daily phentermine.      Heat and fever worsen her fatigue.     She feels sleepiness is better after a nap.    PSG showed she does not have OSA.     Mood/Cognition:    She noted more stress after father died a couple years ago and she moved in with mother.    Doing better since moving out of mom's place but she still sees her daily and mother has health issues.    She is on Paxil 40 mg but still notes depression.      EPWORTH SLEEPINESS SCALE  On a scale of 0 - 3 what is the chance of dozing:  Sitting and  Reading:   1 Watching TV:    2 Sitting inactive in a public place: 1 Passenger in car for one hour: 2 Lying down to rest in the afternoon: 3 Sitting and talking to someone: 1 Sitting quietly after lunch:  1 In a car, stopped in traffic:  0  Total (out of 24):    11/24 (was 12/24 last visit an 18/24 prior to phentermine)   MS History:  She was diagnosed in 1983 after she presented with right hand weakness and a limp.   She had an MRI consistent with MS and she did not need to have a LP.    Those symptoms improved after a few weeks.     She was initially placed on Symmetrel for her fatigue and received some steroids (oral or IV) several times.    She was started on Avonex in 1994.   She switched to Rebif 5 years ago after an exacerbation treated with IV solu-medrol.    Also during that time, her husband left and she was under a lot of stress.    She started seeing me around 2013.  Her  last exacerbation was 2014 with some gait issues.   She tolerates Rebif well but has had some bruising at times.         REVIEW OF SYSTEMS: Constitutional: No fevers, chills, sweats, or change in appetite.   Fatigue.   Excessive sleepines Eyes: No visual changes.   Notes  double vision.  No eye pain Ear, nose and throat: No hearing loss, ear pain, nasal congestion, sore throat.  Recent ear infection Cardiovascular: No chest pain, palpitations Respiratory: No shortness of breath at rest or with exertion.   No wheezes GastrointestinaI: No nausea, vomiting, diarrhea, abdominal pain, fecal incontinence Genitourinary:see above Musculoskeletal: Mild neck pain, back pain.   Muscles stiff Integumentary: No rash, pruritus, skin lesions Neurological: as above Psychiatric: see above Endocrine: No palpitations, diaphoresis, change in appetite, change in weigh or increased thirst Hematologic/Lymphatic: No anemia, petechiae.   Bruises easily at injeciton sites Allergic/Immunologic: No itchy/runny eyes, nasal congestion,  recent allergic reactions, rashes  ALLERGIES: Allergies  Allergen Reactions  . Hydrocodone Nausea And Vomiting  . Oxycodone Nausea And Vomiting  . Tape     bandaids rip her skin/fim    HOME MEDICATIONS:  Current outpatient prescriptions:  .  acidophilus (RISAQUAD) CAPS capsule, Take 1 capsule by mouth daily., Disp: , Rfl:  .  amantadine (SYMMETREL) 100 MG capsule, Take 1 capsule (100 mg total) by mouth 2 (two) times daily., Disp: 180 capsule, Rfl: 3 .  aspirin 81 MG tablet, Take 81 mg by mouth daily., Disp: , Rfl:  .  B Complex-Folic Acid (SUPER B COMPLEX MAXI PO), Take by mouth., Disp: , Rfl:  .  Biotin 5000 MCG CAPS, Take 5,000 mcg by mouth 2 (two) times daily at 8 am and 10 pm., Disp: , Rfl:  .  Chromium 1000 MCG TABS, Take 1,000 tablets by mouth., Disp: , Rfl:  .  clonazePAM (KLONOPIN) 0.5 MG tablet, Take 1 tablet (0.5 mg total) by mouth 2 (two) times daily as needed for anxiety., Disp: 60 tablet, Rfl: 4 .  co-enzyme Q-10 30 MG capsule, Take 30 mg by mouth 3 (three) times daily., Disp: , Rfl:  .  dextroamphetamine (DEXTROSTAT) 10 MG tablet, Take 10 mg by mouth daily. Take one to two tablets once daily as needed., Disp: , Rfl:  .  hydrochlorothiazide (HYDRODIURIL) 25 MG tablet, Take 25 mg by mouth daily., Disp: , Rfl:  .  indomethacin (INDOCIN) 25 MG capsule, One po tid with food as needed, Disp: 30 capsule, Rfl: 1 .  interferon beta-1a (REBIF) 44 MCG/0.5ML SOSY injection, Inject 0.5 mLs (44 mcg total) into the skin 3 (three) times a week., Disp: 12 Syringe, Rfl: 6 .  Iron-Vitamins (GERITOL COMPLETE) TABS, Take by mouth daily., Disp: , Rfl:  .  oxyCODONE (ROXICODONE) 5 MG immediate release tablet, Take 1 tablet (5 mg total) by mouth every 6 (six) hours as needed for severe pain., Disp: 30 tablet, Rfl: 0 .  PARoxetine (PAXIL) 40 MG tablet, Take 1 tablet (40 mg total) by mouth every morning., Disp: 90 tablet, Rfl: 3 .  phentermine 37.5 MG capsule, Take 1 capsule (37.5 mg total) by mouth  every morning., Disp: 90 capsule, Rfl: 1 .  pregabalin (LYRICA) 150 MG capsule, Take 1 capsule (150 mg total) by mouth 2 (two) times daily., Disp: 60 capsule, Rfl: 5 .  vitamin C (ASCORBIC ACID) 500 MG tablet, Take 500 mg by mouth daily., Disp: , Rfl:  .  Vitamin D, Cholecalciferol, 1000 UNITS CAPS, Take 2,000 Units by  mouth daily., Disp: , Rfl:   PAST MEDICAL HISTORY: Past Medical History  Diagnosis Date  . Multiple sclerosis (HCC)   . Movement disorder   . Vision abnormalities     PAST SURGICAL HISTORY: Past Surgical History  Procedure Laterality Date  . Cholecystectomy, laparoscopic    . Cervical cone biopsy      FAMILY HISTORY: Family History  Problem Relation Age of Onset  . Diabetes type II Mother   . Arthritis Mother   . Heart failure Father   . Diabetes type II Father     SOCIAL HISTORY:  Social History   Social History  . Marital Status: Divorced    Spouse Name: N/A  . Number of Children: N/A  . Years of Education: N/A   Occupational History  . Not on file.   Social History Main Topics  . Smoking status: Current Every Day Smoker -- 0.50 packs/day    Types: Cigarettes  . Smokeless tobacco: Not on file  . Alcohol Use: 0.0 oz/week    0 Standard drinks or equivalent per week     Comment: occasional/fim  . Drug Use: No  . Sexual Activity: Not on file   Other Topics Concern  . Not on file   Social History Narrative     PHYSICAL EXAM  Filed Vitals:   02/08/15 1119  BP: 101/70  Pulse: 86  Height:  (1.6 m)  Weight: 192 lb 3.2 oz (87.181 kg)    Body mass index is 34.06 kg/(m^2).   General: The patient is well-developed and well-nourished and in no acute distress  Cardiovascular: The heart has a regular rate and rhythm with a normal S1 and S2. There were no murmurs, gallops or rubs. .  Skin: Extremities are without significant edema.  Musculoskeletal:  Back is nontender  Neurologic Exam  Mental status: The patient is alert and  oriented x 3 at the time of the examination. The patient has apparent normal recent and remote memory, with an apparently normal attention span and concentration ability.   Speech is normal.  Cranial nerves: Extraocular movements are full. There is good facial sensation to soft touch bilaterally.Facial strength is normal.  Trapezius and sternocleidomastoid strength is normal. No dysarthria is noted.  The tongue is midline, and the patient has symmetric elevation of the soft palate. No obvious hearing deficits are noted.  Motor:  Muscle bulk is normal.   Tone is slightly increased in right leg. Strength is  5 / 5 in all 4 extremities.   Sensory: Sensory testing is intact to pinprick, soft touch and vibration sensation in all 4 extremities in arms but decreased touch/scratch in right anterolateral thigh  Coordination: Cerebellar testing reveals good finger-nose-finger and reduced right heel-to-shin .  Gait and station: Station is normal.   Gait is wide and she has poor tandem walk. Romberg is negative.   Reflexes: Deep tendon reflexes are mildly  increased in right leg.       DIAGNOSTIC DATA (LABS, IMAGING, TESTING) - I reviewed patient records, labs, notes, testing and imaging myself where available.     ASSESSMENT AND PLAN  Multiple sclerosis (HCC)  Dysesthesia  High risk medication use  Other fatigue  Depression with anxiety  Gait disturbance  Mixed incontinence    1.   Continue on Rebif therapy.   2. Continue phentermine for her weight loss and fatigue. A refill was provided. 3.  Continue Lyrica 150 mg twice a day for dysesthesias 4.   Renew  Clonazepam 0.5 mg twice a day when necessary for anxiety. We'll also continue Paxil 5.  Use cane for gait. 6.   She will return to see me in 5-6 months or sooner if she has new or worsening neurologic symptoms.  40 minute face-to-face evaluation with greater than one half of the time counseling and coordinating care about her MS  and symptoms.  Richard A. Epimenio Foot, MD, PhD 02/08/2015, 11:26 AM Certified in Neurology, Clinical Neurophysiology, Sleep Medicine, Pain Medicine and Neuroimaging  [facility] [address], [address] [place], [place] [address] [phone]    aaaaa

## 2015-03-05 ENCOUNTER — Ambulatory Visit: Payer: Medicare Other | Admitting: Neurology

## 2015-03-05 ENCOUNTER — Other Ambulatory Visit: Payer: Self-pay | Admitting: Neurology

## 2015-03-06 ENCOUNTER — Other Ambulatory Visit: Payer: Self-pay

## 2015-03-19 ENCOUNTER — Other Ambulatory Visit: Payer: Self-pay | Admitting: Neurology

## 2015-05-11 ENCOUNTER — Other Ambulatory Visit: Payer: Self-pay | Admitting: Neurology

## 2015-05-16 ENCOUNTER — Other Ambulatory Visit: Payer: Self-pay | Admitting: *Deleted

## 2015-05-16 MED ORDER — PREGABALIN 150 MG PO CAPS: 150.0000 mg | ORAL_CAPSULE | Freq: Two times a day (BID) | ORAL | Status: DC

## 2015-05-16 MED ORDER — PHENTERMINE HCL 37.5 MG PO CAPS: 37.5000 mg | ORAL_CAPSULE | ORAL | Status: DC

## 2015-05-16 NOTE — Telephone Encounter (Signed)
Lyrica and Phentermine faxed to [facility] per faxed request.  Fax # [phone]/fim

## 2015-08-09 ENCOUNTER — Ambulatory Visit: Payer: Medicare Other | Admitting: Neurology

## 2015-08-10 ENCOUNTER — Encounter: Payer: Self-pay | Admitting: Neurology

## 2015-08-10 ENCOUNTER — Ambulatory Visit (INDEPENDENT_AMBULATORY_CARE_PROVIDER_SITE_OTHER): Payer: Medicare Other | Admitting: Neurology

## 2015-08-10 VITALS — BP 128/88 | HR 76 | Resp 18 | Ht 63.0 in | Wt 192.0 lb

## 2015-08-10 DIAGNOSIS — G4719 Other hypersomnia: Secondary | ICD-10-CM | POA: Diagnosis not present

## 2015-08-10 DIAGNOSIS — F418 Other specified anxiety disorders: Secondary | ICD-10-CM | POA: Diagnosis not present

## 2015-08-10 DIAGNOSIS — N3946 Mixed incontinence: Secondary | ICD-10-CM | POA: Diagnosis not present

## 2015-08-10 DIAGNOSIS — R208 Other disturbances of skin sensation: Secondary | ICD-10-CM | POA: Diagnosis not present

## 2015-08-10 DIAGNOSIS — R5383 Other fatigue: Secondary | ICD-10-CM | POA: Diagnosis not present

## 2015-08-10 DIAGNOSIS — G35 Multiple sclerosis: Secondary | ICD-10-CM

## 2015-08-10 DIAGNOSIS — R269 Unspecified abnormalities of gait and mobility: Secondary | ICD-10-CM

## 2015-08-10 DIAGNOSIS — Z79899 Other long term (current) drug therapy: Secondary | ICD-10-CM

## 2015-08-10 MED ORDER — INDOMETHACIN 25 MG PO CAPS: ORAL_CAPSULE | ORAL | Status: DC

## 2015-08-10 MED ORDER — DEXTROAMPHETAMINE SULFATE 10 MG PO TABS: 10.0000 mg | ORAL_TABLET | Freq: Every day | ORAL | Status: DC

## 2015-08-10 NOTE — Progress Notes (Signed)
[facility]  PATIENT: Jill Rogers DOB: November 13, 1955  REFERRING DOCTOR OR PCP:  None SOURCE: patient  _________________________________   HISTORICAL  CHIEF COMPLAINT:  Chief Complaint  Patient presents with  . Multiple Sclerosis    HISTORY OF PRESENT ILLNESS:  Jill Rogers is a 60 year old woman with multiple sclerosis.  She denies any recent exacerbation.    She is on Rebif and is tolerating it well.    She fell in the bathtub... Usually she uses a shower chair but 4 weeks ago, shedecided to take a bath and when she stood up, she fell landing flat on coccygeal region.    She has coccyx pain since but it is better than 3-4 weeks ago.   Pain increases with sitting.   Naprosyn has not helped.   She is planning on getting a bar in the tub.  Gait/Strength/sensation:   She has reduced balance and uses a cane.   She tires out easily with walking.    She has some other falls, averaging one a month.    She notes that the right leg is mildly weaker than the left.   She has some spasticity.   She denies arm weakness or spasticity.   She has numbness in her hands when she wakes that resolves after a few minutes.    In the past, she had NCV for similar symptoms and was told she did not have CTS.   Marland Kitchen  Vision:   She denies blurry vision.   She sometimes has diplopia while driving or looking at TV.   She also needs reading glasses   She has not had optic neuritis or eye pain.       Bladder:   She has urinary frequency and urgency.   If she can't get to bathroom in time, she has rare incontinence.   She reports mild hesitancy at times and feels she does not completely empty.     Fatigue/Sleep:   She notes physical > cognitive fatigue.   She has mild sleepiness and occasionally falls asleep on her couch. Both are better since starting daily phentermine.      Heat and fever worsen her fatigue.     She feels sleepiness is better after a nap.    PSG in the past showed she does not have  OSA.     Mood/Cognition:    She notes mild depression but feels it is better since moving out of her mother's place..    She is on Paxil 40 mg but still notes depression.      MS History:  She was diagnosed in 1983 after she presented with right hand weakness and a limp.   She had an MRI consistent with MS and she did not need to have a LP.    Those symptoms improved after a few weeks.     She was initially placed on Symmetrel for her fatigue and received some steroids (oral or IV) several times.    She was started on Avonex in 1994.   She switched to Rebif 5 years ago after an exacerbation treated with IV solu-medrol.    Also during that time, her husband left and she was under a lot of stress.    She started seeing me around 2013.  Her last exacerbation was 2014 with some gait issues.   She tolerates Rebif well but has had some bruising at times.         REVIEW OF SYSTEMS:  Constitutional: No fevers, chills, sweats, or change in appetite.   Fatigue.   She reports excessive sleepines Eyes: No visual changes.   Notes  double vision.  No eye pain Ear, nose and throat: No hearing loss, ear pain, nasal congestion, sore throat.  Recent ear infection Cardiovascular: No chest pain, palpitations Respiratory: No shortness of breath at rest or with exertion.   No wheezes GastrointestinaI: No nausea, vomiting, diarrhea, abdominal pain, fecal incontinence Genitourinary:see above Musculoskeletal: Mild neck pain, back pain.   Muscles stiff Integumentary: No rash, pruritus, skin lesions Neurological: as above Psychiatric: see above Endocrine: No palpitations, diaphoresis, change in appetite, change in weigh or increased thirst Hematologic/Lymphatic: No anemia, petechiae.   Bruises easily at injeciton sites Allergic/Immunologic: No itchy/runny eyes, nasal congestion, recent allergic reactions, rashes  ALLERGIES: Allergies  Allergen Reactions  . Hydrocodone Nausea And Vomiting  . Oxycodone Nausea And  Vomiting  . Tape     bandaids rip her skin/fim    HOME MEDICATIONS:  Current outpatient prescriptions:  .  acidophilus (RISAQUAD) CAPS capsule, Take 1 capsule by mouth daily., Disp: , Rfl:  .  amantadine (SYMMETREL) 100 MG capsule, Take 1 capsule (100 mg total) by mouth 2 (two) times daily., Disp: 180 capsule, Rfl: 3 .  aspirin 81 MG tablet, Take 81 mg by mouth daily., Disp: , Rfl:  .  B Complex-Folic Acid (SUPER B COMPLEX MAXI PO), Take by mouth., Disp: , Rfl:  .  Biotin 5000 MCG CAPS, Take 5,000 mcg by mouth 2 (two) times daily at 8 am and 10 pm., Disp: , Rfl:  .  Chromium 1000 MCG TABS, Take 1,000 tablets by mouth., Disp: , Rfl:  .  clonazePAM (KLONOPIN) 0.5 MG tablet, Take 1 tablet (0.5 mg total) by mouth 2 (two) times daily as needed for anxiety., Disp: 60 tablet, Rfl: 4 .  co-enzyme Q-10 30 MG capsule, Take 30 mg by mouth 3 (three) times daily., Disp: , Rfl:  .  dextroamphetamine (DEXTROSTAT) 10 MG tablet, Take 10 mg by mouth daily. Take one to two tablets once daily as needed., Disp: , Rfl:  .  hydrochlorothiazide (HYDRODIURIL) 25 MG tablet, Take 25 mg by mouth daily., Disp: , Rfl:  .  indomethacin (INDOCIN) 25 MG capsule, One po tid with food as needed, Disp: 30 capsule, Rfl: 1 .  interferon beta-1a (REBIF) 44 MCG/0.5ML SOSY injection, Inject 0.5 mLs (44 mcg total) into the skin 3 (three) times a week., Disp: 12 Syringe, Rfl: 6 .  Iron-Vitamins (GERITOL COMPLETE) TABS, Take by mouth daily., Disp: , Rfl:  .  oxyCODONE (ROXICODONE) 5 MG immediate release tablet, Take 1 tablet (5 mg total) by mouth every 6 (six) hours as needed for severe pain., Disp: 30 tablet, Rfl: 0 .  PARoxetine (PAXIL) 40 MG tablet, Take 1 tablet by mouth  every morning, Disp: 90 tablet, Rfl: 3 .  phentermine 37.5 MG capsule, Take 1 capsule (37.5 mg total) by mouth every morning., Disp: 90 capsule, Rfl: 1 .  pregabalin (LYRICA) 150 MG capsule, Take 1 capsule (150 mg total) by mouth 2 (two) times daily., Disp: 180  capsule, Rfl: 1 .  vitamin C (ASCORBIC ACID) 500 MG tablet, Take 500 mg by mouth daily., Disp: , Rfl:  .  Vitamin D, Cholecalciferol, 1000 UNITS CAPS, Take 2,000 Units by mouth daily., Disp: , Rfl:   PAST MEDICAL HISTORY: Past Medical History  Diagnosis Date  . Multiple sclerosis (HCC)   . Movement disorder   . Vision abnormalities  PAST SURGICAL HISTORY: Past Surgical History  Procedure Laterality Date  . Cholecystectomy, laparoscopic    . Cervical cone biopsy      FAMILY HISTORY: Family History  Problem Relation Age of Onset  . Diabetes type II Mother   . Arthritis Mother   . Heart failure Father   . Diabetes type II Father     SOCIAL HISTORY:  Social History   Social History  . Marital Status: Divorced    Spouse Name: N/A  . Number of Children: N/A  . Years of Education: N/A   Occupational History  . Not on file.   Social History Main Topics  . Smoking status: Current Every Day Smoker -- 0.50 packs/day    Types: Cigarettes  . Smokeless tobacco: Not on file  . Alcohol Use: 0.0 oz/week    0 Standard drinks or equivalent per week     Comment: occasional/fim  . Drug Use: No  . Sexual Activity: Not on file   Other Topics Concern  . Not on file   Social History Narrative     PHYSICAL EXAM  There were no vitals filed for this visit.  There is no weight on file to calculate BMI.   General: The patient is well-developed and well-nourished and in no acute distress   Neurologic Exam  Mental status: The patient is alert and oriented x 3 at the time of the examination. The patient has apparent normal recent and remote memory, with an apparently normal attention span and concentration ability.   Speech is normal.  Cranial nerves: Extraocular movements are full. Facial strength is normal.  Trapezius and sternocleidomastoid strength is normal. No dysarthria is noted.  The tongue is midline, and the patient has symmetric elevation of the soft palate. No  obvious hearing deficits are noted.  Motor:  Muscle bulk is normal.   Tone is slightly increased in right leg. Strength is  5 / 5 in all 4 extremities.   Sensory: Sensory testing is intact to pinprick, soft touch and vibration sensation in all 4 extremities in arms but decreased touch/scratch in right anterolateral thigh  Coordination: Cerebellar testing reveals good finger-nose-finger  .  Gait and station: Station is normal.   Gait is wide and she has reduced tandem walk. Romberg is negative.   Reflexes: Deep tendon reflexes are mildly  increased in right leg.       DIAGNOSTIC DATA (LABS, IMAGING, TESTING) - I reviewed patient records, labs, notes, testing and imaging myself where available.     ASSESSMENT AND PLAN  Multiple sclerosis (HCC)  Depression with anxiety  Dysesthesia  Gait disturbance  Other fatigue  High risk medication use  Mixed incontinence  Excessive daytime sleepiness    1.   Continue on Rebif therapy.   2.  Phentermine was not covered so will put back on Dextroamphetamine 10 mg po bid prn.   3.   Continue Lyrica 150 mg twice a day for dysesthesias 4.  COntinue Clonazepam and Paxil 5.  Use cane for gait.   She will be getting a safety bar in tub 6.   She will return to see me in 5 months or sooner if she has new or worsening neurologic symptoms.  Pearletha Furl. Epimenio Foot, MD, PhD 08/10/2015, 11:17 AM Certified in Neurology, Clinical Neurophysiology, Sleep Medicine, Pain Medicine and Neuroimaging  [facility] [address] [place], [place] [address] [phone]    aaaaa

## 2015-08-20 ENCOUNTER — Telehealth: Payer: Self-pay | Admitting: *Deleted

## 2015-08-20 MED ORDER — PAROXETINE HCL 40 MG PO TABS: 40.0000 mg | ORAL_TABLET | Freq: Every day | ORAL | Status: DC

## 2015-08-20 NOTE — Telephone Encounter (Signed)
Pt called requesting refill  on PARoxetine (PAXIL) 40 MG tablet. Pt have 1 pill left. Please call [phone]

## 2015-08-20 NOTE — Telephone Encounter (Signed)
Pt returned . Will await nurse call

## 2015-08-20 NOTE — Telephone Encounter (Signed)
I have spoken with Jill Rogers--she sts. she is out of Paxil, has spoken with [facility], and they can't ship her rx. until 7-12.  30 day interim supply escribed to [facility] [place] per Dalia's request/fim

## 2015-08-20 NOTE — Telephone Encounter (Signed)
LMTC./fim 

## 2015-11-15 ENCOUNTER — Telehealth: Payer: Self-pay | Admitting: Neurology

## 2015-11-15 MED ORDER — PREGABALIN 150 MG PO CAPS: 150.0000 mg | ORAL_CAPSULE | Freq: Two times a day (BID) | ORAL | 1 refills | Status: DC

## 2015-11-15 NOTE — Telephone Encounter (Signed)
Rx. faxed to [facility]/fim

## 2015-11-15 NOTE — Telephone Encounter (Signed)
Rx. awaiting RAS sig/fim 

## 2015-11-15 NOTE — Telephone Encounter (Signed)
Patient called to request refill of pregabalin (LYRICA) 150 MG capsule °

## 2015-11-15 NOTE — Telephone Encounter (Signed)
Lyrica rx. faxed to [facility]

## 2015-11-15 NOTE — Telephone Encounter (Signed)
Pt said she rec'd a call. She is asking for the RX to be sent to [facility] not [facility]

## 2016-01-15 ENCOUNTER — Encounter: Payer: Self-pay | Admitting: Neurology

## 2016-01-15 ENCOUNTER — Ambulatory Visit (INDEPENDENT_AMBULATORY_CARE_PROVIDER_SITE_OTHER): Payer: Medicare Other | Admitting: Neurology

## 2016-01-15 VITALS — BP 138/80 | HR 76 | Resp 18 | Ht 63.0 in | Wt 201.5 lb

## 2016-01-15 DIAGNOSIS — G4719 Other hypersomnia: Secondary | ICD-10-CM

## 2016-01-15 DIAGNOSIS — G35 Multiple sclerosis: Secondary | ICD-10-CM

## 2016-01-15 DIAGNOSIS — R5383 Other fatigue: Secondary | ICD-10-CM

## 2016-01-15 DIAGNOSIS — R208 Other disturbances of skin sensation: Secondary | ICD-10-CM | POA: Diagnosis not present

## 2016-01-15 DIAGNOSIS — R269 Unspecified abnormalities of gait and mobility: Secondary | ICD-10-CM

## 2016-01-15 DIAGNOSIS — F418 Other specified anxiety disorders: Secondary | ICD-10-CM

## 2016-01-15 MED ORDER — DEXTROAMPHETAMINE SULFATE 10 MG PO TABS: 10.0000 mg | ORAL_TABLET | Freq: Every day | ORAL | 0 refills | Status: DC

## 2016-01-15 MED ORDER — INDOMETHACIN 25 MG PO CAPS: ORAL_CAPSULE | ORAL | 1 refills | Status: DC

## 2016-01-15 MED ORDER — LAMOTRIGINE 100 MG PO TABS: 100.0000 mg | ORAL_TABLET | Freq: Every day | ORAL | 5 refills | Status: DC

## 2016-01-15 MED ORDER — CLONAZEPAM 0.5 MG PO TABS: 0.5000 mg | ORAL_TABLET | Freq: Two times a day (BID) | ORAL | 4 refills | Status: DC | PRN

## 2016-01-15 NOTE — Progress Notes (Signed)
[facility]  PATIENT: Jill Rogers DOB: [birth year]  REFERRING DOCTOR OR PCP:  None SOURCE: patient  _________________________________   HISTORICAL  CHIEF COMPLAINT:  Chief Complaint  Patient presents with  . Multiple Sclerosis    Sts. she continues to tolerate Rebif well./fim    HISTORY OF PRESENT ILLNESS:  Jill Rogers is a 60 year old woman with multiple sclerosis.   She is noting more fatigue.  She recounts a recent beach trip and she ended up spending most of the trip in the hotel room due to fatigue.  She gets wiped out climbing a flight of steps.   She also had a day 01/04/16, day after Thanksgiving, where she felt much weaker than typical and had much more fatigue.   She also felt off balanced and dizzy.   She noted the day was warmer than the rest of the week.   The next day she felt better and was able to get out of the house but still felt more tired and she felt her legs were giving out later that day.      MS:   She denies any recent exacerbation.    She is on Rebif and is tolerating it well.    MRI Brain 05/06/14 was stable compared to 2014 MRI showing classic MS lesions.    Gait/Strength/sensation:   She has reduced balance and uses a cane.  However, she needs to put down to do some tasks requiring both hands.   She tires out easily with walking.  She feels weaker, legs more than arms.   She has some spasticity.   She denies arm weakness or spasticity.      Vision:   She denies blurry vision but eyes are itching some.   She sometimes has diplopia while driving or looking at TV or when tired   She also needs reading glasses   She has not had optic neuritis or eye pain.       Bladder:   She has urinary frequency and urgency.   If she can't get to bathroom in time, she has rare incontinence.   She reports mild hesitancy at times and feels she does not completely empty.     Fatigue/Sleep:   She notes physical > cognitive fatigue.   She also mild  sleepiness and occasionally falls asleep on her couch.   Dextroamphetamine helped the sleepiness than the fatigue.    Heat and fever worsen her fatigue.     She feels sleepiness is better after a nap.    PSG in the past showed she does not have OSA.     Mood/Cognition:    She notes mild depression but feels it is better since moving out of her mother's place..    She is on Paxil 40 mg but still notes depression.    She takes clonazepam when out of the house around other people.    MS History:  She was diagnosed in 1983 after she presented with right hand weakness and a limp.   She had an MRI consistent with MS and she did not need to have a LP.    Those symptoms improved after a few weeks.     She was initially placed on Symmetrel for her fatigue and received some steroids (oral or IV) several times.    She was started on Avonex in 1994.   She switched to Rebif 5 years ago after an exacerbation treated with IV solu-medrol.  Also during that time, her husband left and she was under a lot of stress.    She started seeing me around 2013.  Her last exacerbation was 2014 with some gait issues.   She tolerates Rebif well but has had some bruising at times.         REVIEW OF SYSTEMS: Constitutional: No fevers, chills, sweats, or change in appetite.   Fatigue.   She reports excessive sleepines Eyes: No visual changes.   Notes  double vision.  No eye pain Ear, nose and throat: No hearing loss, ear pain, nasal congestion, sore throat.  Recent ear infection Cardiovascular: No chest pain, palpitations Respiratory: No shortness of breath at rest or with exertion.   No wheezes GastrointestinaI: No nausea, vomiting, diarrhea, abdominal pain, fecal incontinence Genitourinary:see above Musculoskeletal: Mild neck pain, back pain.   Muscles stiff Integumentary: No rash, pruritus, skin lesions Neurological: as above Psychiatric: see above Endocrine: No palpitations, diaphoresis, change in appetite, change in  weigh or increased thirst Hematologic/Lymphatic: No anemia, petechiae.   Bruises easily at injeciton sites Allergic/Immunologic: No itchy/runny eyes, nasal congestion, recent allergic reactions, rashes  ALLERGIES: Allergies  Allergen Reactions  . Hydrocodone Nausea And Vomiting  . Oxycodone Nausea And Vomiting  . Tape     bandaids rip her skin/fim    HOME MEDICATIONS:  Current Outpatient Prescriptions:  .  acidophilus (RISAQUAD) CAPS capsule, Take 1 capsule by mouth daily., Disp: , Rfl:  .  amantadine (SYMMETREL) 100 MG capsule, Take 1 capsule (100 mg total) by mouth 2 (two) times daily., Disp: 180 capsule, Rfl: 3 .  aspirin 81 MG tablet, Take 81 mg by mouth daily., Disp: , Rfl:  .  B Complex-Folic Acid (SUPER B COMPLEX MAXI PO), Take by mouth., Disp: , Rfl:  .  Biotin 5000 MCG CAPS, Take 5,000 mcg by mouth 2 (two) times daily at 8 am and 10 pm., Disp: , Rfl:  .  Chromium 1000 MCG TABS, Take 1,000 tablets by mouth., Disp: , Rfl:  .  clonazePAM (KLONOPIN) 0.5 MG tablet, Take 1 tablet (0.5 mg total) by mouth 2 (two) times daily as needed for anxiety., Disp: 60 tablet, Rfl: 4 .  co-enzyme Q-10 30 MG capsule, Take 30 mg by mouth 3 (three) times daily., Disp: , Rfl:  .  dextroamphetamine (DEXTROSTAT) 10 MG tablet, Take 1 tablet (10 mg total) by mouth daily. Take one to two tablets once daily as needed., Disp: 60 tablet, Rfl: 0 .  hydrochlorothiazide (HYDRODIURIL) 25 MG tablet, Take 25 mg by mouth daily., Disp: , Rfl:  .  indomethacin (INDOCIN) 25 MG capsule, One po tid with food as needed, Disp: 90 capsule, Rfl: 1 .  interferon beta-1a (REBIF) 44 MCG/0.5ML SOSY injection, Inject 0.5 mLs (44 mcg total) into the skin 3 (three) times a week., Disp: 12 Syringe, Rfl: 6 .  Iron-Vitamins (GERITOL COMPLETE) TABS, Take by mouth daily., Disp: , Rfl:  .  PARoxetine (PAXIL) 40 MG tablet, Take 1 tablet (40 mg total) by mouth daily., Disp: 30 tablet, Rfl: 0 .  Specialty Vitamins Products (MAGNESIUM,  AMINO ACID CHELATE,) 133 MG tablet, Take 1 tablet by mouth 2 (two) times daily., Disp: , Rfl:  .  UNABLE TO FIND, Ultimate Flora Probiotic 25 billion once daily/fim, Disp: , Rfl:  .  vitamin C (ASCORBIC ACID) 500 MG tablet, Take 500 mg by mouth daily., Disp: , Rfl:  .  Vitamin D, Cholecalciferol, 1000 UNITS CAPS, Take 2,000 Units by mouth daily., Disp: ,  Rfl:  .  lamoTRIgine (LAMICTAL) 100 MG tablet, Take 1 tablet (100 mg total) by mouth daily., Disp: 60 tablet, Rfl: 5 .  oxyCODONE (ROXICODONE) 5 MG immediate release tablet, Take 1 tablet (5 mg total) by mouth every 6 (six) hours as needed for severe pain. (Patient not taking: Reported on 01/15/2016), Disp: 30 tablet, Rfl: 0  PAST MEDICAL HISTORY: Past Medical History:  Diagnosis Date  . Movement disorder   . Multiple sclerosis (HCC)   . Vision abnormalities     PAST SURGICAL HISTORY: Past Surgical History:  Procedure Laterality Date  . CERVICAL CONE BIOPSY    . CHOLECYSTECTOMY, LAPAROSCOPIC      FAMILY HISTORY: Family History  Problem Relation Age of Onset  . Diabetes type II Mother   . Arthritis Mother   . Heart failure Father   . Diabetes type II Father     SOCIAL HISTORY:  Social History   Social History  . Marital status: Divorced    Spouse name: N/A  . Number of children: N/A  . Years of education: N/A   Occupational History  . Not on file.   Social History Main Topics  . Smoking status: Current Every Day Smoker    Packs/day: 0.50    Types: Cigarettes  . Smokeless tobacco: Not on file  . Alcohol use 0.0 oz/week     Comment: occasional/fim  . Drug use: No  . Sexual activity: Not on file   Other Topics Concern  . Not on file   Social History Narrative  . No narrative on file     PHYSICAL EXAM  Vitals:   01/15/16 1119  BP: 138/80  Pulse: 76  Resp: 18  Weight: 201 lb 8 oz (91.4 kg)  Height: 5\' 3"  (1.6 m)    Body mass index is 35.69 kg/m.   General: The patient is well-developed and  well-nourished and in no acute distress   Neurologic Exam  Mental status: The patient is alert and oriented x 3 at the time of the examination. The patient has apparent normal recent and remote memory, with an apparently normal attention span and concentration ability.   Speech is normal.  Cranial nerves: Extraocular movements are full. Facial strength is normal.  Trapezius and sternocleidomastoid strength is normal. No dysarthria is noted.  The tongue is midline, and the patient has symmetric elevation of the soft palate. No obvious hearing deficits are noted.  Motor:  Muscle bulk is normal.   Tone is slightly increased in right leg. Strength is  5 / 5 in all 4 extremities.   Sensory: Sensory testing is intact to pinprick, soft touch and vibration sensation in all 4 extremities in arms but decreased touch/scratch in right anterolateral thigh  Coordination: Cerebellar testing reveals good finger-nose-finger  .  Gait and station: Station is normal.   Gait is wide and she has reduced tandem walk. Romberg is negative.   Reflexes: Deep tendon reflexes are mildly  increased in right leg.       DIAGNOSTIC DATA (LABS, IMAGING, TESTING) - I reviewed patient records, labs, notes, testing and imaging myself where available.     ASSESSMENT AND PLAN  Multiple sclerosis (HCC)  Dysesthesia  Excessive daytime sleepiness  Gait disturbance  Depression with anxiety  Other fatigue   1.   Continue on Rebif therapy.   2.   Dextroamphetamine 10 mg po bid prn.   3.   Change Lyrica 150 mg twice a day to lamotrigine for dysesthesias 4.  COntinue Clonazepam and Paxil 5.  Use cane for gait. \  She will be getting a safety bar in tub 6.   She will return to see me in 5 months or sooner if she has new or worsening neurologic symptoms.  40 minutes face-to-face evaluation with greater than one half the time counseling and coordinating care about her MS and related symptoms.  Richard A. Epimenio FootSater, MD,  PhD 01/15/2016, 11:54 AM Certified in Neurology, Clinical Neurophysiology, Sleep Medicine, Pain Medicine and Neuroimaging  [facility] [address], [address] [place], [place] [address] [phone]

## 2016-01-15 NOTE — Patient Instructions (Signed)
The pharmacy has the prescription for lamotrigine 100 mg tablets. For 5 days, just take one half pill a day. For the next 5 days, take one half pill twice a day. For the next 5 days, take one half pill 3 times a day Then start taking one pill twice a day from this point on.    In the future, we may increase the dose further.  If you get a rash, need to stop the medication and not take it again.   After 2-3 weeks, stop the morning Lyrica and 2-3 weeks stop the night time pill

## 2016-02-13 ENCOUNTER — Telehealth: Payer: Self-pay | Admitting: *Deleted

## 2016-02-13 MED ORDER — INTERFERON BETA-1A 44 MCG/0.5ML ~~LOC~~ SOSY: 44.0000 ug | PREFILLED_SYRINGE | SUBCUTANEOUS | 3 refills | Status: DC

## 2016-02-13 NOTE — Telephone Encounter (Signed)
Rebif escribed to [facility] per faxed request/fim

## 2016-02-14 ENCOUNTER — Telehealth: Payer: Self-pay | Admitting: Neurology

## 2016-02-14 NOTE — Telephone Encounter (Signed)
Left message for a return call

## 2016-02-14 NOTE — Telephone Encounter (Signed)
Pt called says she has d/c lyrica on 02/11/16 and is taking lamoTRIgine (LAMICTAL) 100 MG tablet . Over the weekend she began having left sided tremors, arm, trunk, leg. She is having difficulty sleeping since this has started. She sts she is not experiencing nerve pain just twitching. Sts she started taking doxycline on 02/06/16 for chronic boils on the skin. She is wondering if this could be a new symptom of MS or medication related. Please call

## 2016-02-15 ENCOUNTER — Telehealth: Payer: Self-pay | Admitting: Diagnostic Neuroimaging

## 2016-02-15 NOTE — Telephone Encounter (Signed)
Pt called in. Having more intermittent tremors in the last 1 week (left hand) mainly at night. Tremors less noticeable in the daytime.  Will forward to Dr. Epimenio Foot for review. -VRP

## 2016-02-15 NOTE — Telephone Encounter (Signed)
I have spoken with Jill Rogers this afternoon and per RAS, explained that iit is difficult to say exactly why tremors are worse right now.  If  increased tremors are due to tapering off of the Lyrica, they should be short lived.  It may be that she is having exacerbated sx. in response to infection; if this is the case, she should begin to improve with antibiotic therapy.  She verbalized understanding of same, will call back after about 2 weeks, if she is not improving/fim

## 2016-02-15 NOTE — Telephone Encounter (Signed)
See 2nd note started by Dr. Demetrius Charity.  I have spoken with pt. today/fim

## 2016-02-26 MED ORDER — LAMOTRIGINE 100 MG PO TABS: 100.0000 mg | ORAL_TABLET | Freq: Two times a day (BID) | ORAL | 5 refills | Status: DC

## 2016-02-26 NOTE — Addendum Note (Signed)
Addended by: Candis Schatz I on: 02/26/2016 12:17 PM   Modules accepted: Orders

## 2016-02-26 NOTE — Telephone Encounter (Signed)
Pt said pharmacy/ [facility] [address] will not fill lamoTRIgine (LAMICTAL) 100 MG tablet, need new RX with new directions. She has been taking 1 bid and has ran out of medication.  Please call

## 2016-02-26 NOTE — Telephone Encounter (Signed)
I have spoken with Jill Rogers, and new Lamictal rx. has been sent to [facility]

## 2016-03-05 ENCOUNTER — Encounter: Payer: Self-pay | Admitting: Neurology

## 2016-03-05 ENCOUNTER — Ambulatory Visit (INDEPENDENT_AMBULATORY_CARE_PROVIDER_SITE_OTHER): Payer: Medicare Other | Admitting: Neurology

## 2016-03-05 VITALS — BP 126/64 | HR 94 | Resp 20 | Ht 63.0 in | Wt 193.0 lb

## 2016-03-05 DIAGNOSIS — R5383 Other fatigue: Secondary | ICD-10-CM

## 2016-03-05 DIAGNOSIS — G35 Multiple sclerosis: Secondary | ICD-10-CM | POA: Diagnosis not present

## 2016-03-05 DIAGNOSIS — N3946 Mixed incontinence: Secondary | ICD-10-CM

## 2016-03-05 DIAGNOSIS — R251 Tremor, unspecified: Secondary | ICD-10-CM

## 2016-03-05 DIAGNOSIS — F418 Other specified anxiety disorders: Secondary | ICD-10-CM

## 2016-03-05 DIAGNOSIS — R269 Unspecified abnormalities of gait and mobility: Secondary | ICD-10-CM | POA: Diagnosis not present

## 2016-03-05 DIAGNOSIS — G253 Myoclonus: Secondary | ICD-10-CM

## 2016-03-05 DIAGNOSIS — G35D Multiple sclerosis, unspecified: Secondary | ICD-10-CM

## 2016-03-05 MED ORDER — METOPROLOL SUCCINATE ER 25 MG PO TB24: 25.0000 mg | ORAL_TABLET | Freq: Every day | ORAL | 5 refills | Status: DC

## 2016-03-05 MED ORDER — DEXTROAMPHETAMINE SULFATE 10 MG PO TABS: 10.0000 mg | ORAL_TABLET | Freq: Every day | ORAL | 0 refills | Status: DC

## 2016-03-05 NOTE — Telephone Encounter (Signed)
Pt called says she is still having the tremors, they are lasting longer and she has fallen 5 times in the past week. She thinks this could be exacerbation.

## 2016-03-05 NOTE — Progress Notes (Signed)
[facility]  PATIENT: Jill Rogers DOB: February 01, 1956  REFERRING DOCTOR OR PCP:  None SOURCE: patient  _________________________________   HISTORICAL  CHIEF COMPLAINT:  Chief Complaint  Patient presents with  . Multiple Sclerosis    Sts. tremor in left hand continues to worsen,  occasional tremor left foot, most noticeable when she is in bed at night.  Feels balance is worse and has been falling more--5 times in the last week.  Sts. she is currenlty being treated by a dermatologist for multiple abscesses--boils/fim  . Gait Disturbance  . Tremors    HISTORY OF PRESENT ILLNESS:  Jill Rogers is a 61 year old woman with multiple sclerosis.   She is noting more fatigue, worse gait and hand tremor.    She also has had some jerks, always while drowsy, usually in bed.   MS:   She denies any definite exacerbation but feels worse.    She is on Rebif and is tolerating it well.    MRI Brain 05/06/14 was stable compared to 2014 MRI showing classic MS lesions.    Gait/Strength/sensation:   She has reduced gait and feels balance is poor.   She uses a cane.  She feels her walking endurance is worse and she is walking more lsowly She tires out easily with walking.  She feels weaker, legs more than arms.   She has some spasticity.   She denies arm weakness or spasticity.      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      Lamotrigine is helping dysesthesia as well as Lyrica without weight gain.    Vision:   She denies blurry vision but eyes are itching some.   She sometimes has diplopia while driving or looking at TV or when tired   She also needs reading glasses   She has not had optic neuritis or eye pain.       Bladder:   She has urinary frequency and urgency.   If she can't get to bathroom in time, she has rare incontinence.   She reports mild hesitancy at times and feels she does not completely empty.    She had a sling operation which  greatly helped the urgency and incontinence.     Fatigue/Sleep:   She notes physical > cognitive fatigue.   She also mild sleepiness and occasionally falls asleep on her couch.   Dextroamphetamine helped the sleepiness than the fatigue.    Heat and fever worsen her fatigue.     She feels sleepiness is better after a nap.  She sometimes has trouble with sleep onset since the jerks started.   PSG in the past showed she does not have OSA.     Mood/Cognition:    She feels mood is ok with some mild depression but nothing significant.      She is on Paxil 40 mg but still notes depression.    She takes clonazepam when out of the house around other people.    Other:   She has had multiple abscesses and seeing dermatology.  MS History:  She was diagnosed in 1983 after she presented with right hand weakness and a limp.   She had an MRI consistent with MS and she did not need to have a LP.    Those symptoms improved after a few weeks.     She was initially placed on Symmetrel for her fatigue and received some steroids (  oral or IV) several times.    She was started on Avonex in 1994.   She switched to Rebif 5 years ago after an exacerbation treated with IV solu-medrol.    Also during that time, her husband left and she was under a lot of stress.    She started seeing me around 2013.  Her last exacerbation was 2014 with some gait issues.   She tolerates Rebif well but has had some bruising at times.         REVIEW OF SYSTEMS: Constitutional: No fevers, chills, sweats, or change in appetite.   Fatigue.   She reports excessive sleepines Eyes: No visual changes.   Notes  double vision.  No eye pain Ear, nose and throat: No hearing loss, ear pain, nasal congestion, sore throat.  Recent ear infection Cardiovascular: No chest pain, palpitations Respiratory: No shortness of breath at rest or with exertion.   No wheezes GastrointestinaI: No nausea, vomiting, diarrhea, abdominal pain, fecal  incontinence Genitourinary:see above Musculoskeletal: Mild neck pain, back pain.   Muscles stiff Integumentary: No rash, pruritus.   Has boils/abscesses Neurological: as above Psychiatric: see above Endocrine: No palpitations, diaphoresis, change in appetite, change in weigh or increased thirst Hematologic/Lymphatic: No anemia, petechiae.   Bruises easily at injeciton sites Allergic/Immunologic: No itchy/runny eyes, nasal congestion, recent allergic reactions, rashes  ALLERGIES: Allergies  Allergen Reactions  . Hydrocodone Nausea And Vomiting  . Oxycodone Nausea And Vomiting  . Tape     bandaids rip her skin/fim    HOME MEDICATIONS:  Current Outpatient Prescriptions:  .  acidophilus (RISAQUAD) CAPS capsule, Take 1 capsule by mouth daily., Disp: , Rfl:  .  amantadine (SYMMETREL) 100 MG capsule, Take 1 capsule (100 mg total) by mouth 2 (two) times daily., Disp: 180 capsule, Rfl: 3 .  B Complex-Folic Acid (SUPER B COMPLEX MAXI PO), Take by mouth., Disp: , Rfl:  .  Biotin 5000 MCG CAPS, Take 5,000 mcg by mouth 2 (two) times daily at 8 am and 10 pm., Disp: , Rfl:  .  clonazePAM (KLONOPIN) 0.5 MG tablet, Take 1 tablet (0.5 mg total) by mouth 2 (two) times daily as needed for anxiety., Disp: 60 tablet, Rfl: 4 .  dextroamphetamine (DEXTROSTAT) 10 MG tablet, Take 1 tablet (10 mg total) by mouth daily. Take one to two tablets once daily as needed., Disp: 60 tablet, Rfl: 0 .  hydrochlorothiazide (HYDRODIURIL) 25 MG tablet, Take 25 mg by mouth daily., Disp: , Rfl:  .  indomethacin (INDOCIN) 25 MG capsule, One po tid with food as needed, Disp: 90 capsule, Rfl: 1 .  interferon beta-1a (REBIF) 44 MCG/0.5ML SOSY injection, Inject 0.5 mLs (44 mcg total) into the skin 3 (three) times a week., Disp: 36 Syringe, Rfl: 3 .  lamoTRIgine (LAMICTAL) 100 MG tablet, Take 1 tablet (100 mg total) by mouth 2 (two) times daily., Disp: 60 tablet, Rfl: 5 .  oxyCODONE (ROXICODONE) 5 MG immediate release tablet,  Take 1 tablet (5 mg total) by mouth every 6 (six) hours as needed for severe pain., Disp: 30 tablet, Rfl: 0 .  PARoxetine (PAXIL) 40 MG tablet, Take 1 tablet (40 mg total) by mouth daily., Disp: 30 tablet, Rfl: 0 .  UNABLE TO FIND, Ultimate Flora Probiotic 25 billion once daily/fim, Disp: , Rfl:  .  Vitamin D, Cholecalciferol, 1000 UNITS CAPS, Take 2,000 Units by mouth daily., Disp: , Rfl:  .  aspirin 81 MG tablet, Take 81 mg by mouth daily., Disp: , Rfl:  .  Chromium 1000 MCG TABS, Take 1,000 tablets by mouth., Disp: , Rfl:  .  co-enzyme Q-10 30 MG capsule, Take 30 mg by mouth 3 (three) times daily., Disp: , Rfl:  .  doxycycline (VIBRAMYCIN) 100 MG capsule, , Disp: , Rfl:  .  Iron-Vitamins (GERITOL COMPLETE) TABS, Take by mouth daily., Disp: , Rfl:  .  metoprolol succinate (TOPROL XL) 25 MG 24 hr tablet, Take 1 tablet (25 mg total) by mouth daily., Disp: 30 tablet, Rfl: 5 .  PROAIR HFA 108 (90 Base) MCG/ACT inhaler, , Disp: , Rfl:  .  Specialty Vitamins Products (MAGNESIUM, AMINO ACID CHELATE,) 133 MG tablet, Take 1 tablet by mouth 2 (two) times daily., Disp: , Rfl:  .  vitamin C (ASCORBIC ACID) 500 MG tablet, Take 500 mg by mouth daily., Disp: , Rfl:   PAST MEDICAL HISTORY: Past Medical History:  Diagnosis Date  . Movement disorder   . Multiple sclerosis (HCC)   . Vision abnormalities     PAST SURGICAL HISTORY: Past Surgical History:  Procedure Laterality Date  . CERVICAL CONE BIOPSY    . CHOLECYSTECTOMY, LAPAROSCOPIC      FAMILY HISTORY: Family History  Problem Relation Age of Onset  . Diabetes type II Mother   . Arthritis Mother   . Heart failure Father   . Diabetes type II Father     SOCIAL HISTORY:  Social History   Social History  . Marital status: Divorced    Spouse name: N/A  . Number of children: N/A  . Years of education: N/A   Occupational History  . Not on file.   Social History Main Topics  . Smoking status: Current Every Day Smoker    Packs/day:  0.50    Types: Cigarettes  . Smokeless tobacco: Never Used  . Alcohol use 0.0 oz/week     Comment: occasional/fim  . Drug use: No  . Sexual activity: Not on file   Other Topics Concern  . Not on file   Social History Narrative  . No narrative on file     PHYSICAL EXAM  Vitals:   03/05/16 1508  BP: 126/64  Pulse: 94  Resp: 20  Weight: 193 lb (87.5 kg)  Height: 5\' 3"  (1.6 m)    Body mass index is 34.19 kg/m.   General: The patient is well-developed and well-nourished and in no acute distress   Neurologic Exam  Mental status: The patient is alert and oriented x 3 at the time of the examination. The patient has apparent normal recent and remote memory, with an apparently normal attention span and concentration ability.   Speech is normal.  Cranial nerves: Extraocular movements are full. Facial strength is normal.  Trapezius and sternocleidomastoid strength is normal. No dysarthria is noted.  The tongue is midline, and the patient has symmetric elevation of the soft palate. No obvious hearing deficits are noted.  Motor:  Muscle bulk is normal.   Tone is slightly increased in right leg. Strength is  5 / 5 in all 4 extremities.   Sensory: Sensory testing is intact to pinprick, soft touch and vibration sensation in all 4 extremities in arms but decreased touch/scratch in right anterolateral thigh  Coordination: Cerebellar testing reveals good finger-nose-finger  .  Gait and station: Station is normal.   Gait is wide and she has reduced tandem walk. Romberg is negative.   Reflexes: Deep tendon reflexes are mildly  increased in right leg.       DIAGNOSTIC DATA (LABS,  IMAGING, TESTING) - I reviewed patient records, labs, notes, testing and imaging myself where available.     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   Continue on Rebif therapy.     We will check an MRI of the rule out subclinical progression. If this is occurring we need to consider a different disease modifying therapy. Additionally we'll check an MRI of the cervical spine to assess for subclinical progression and also to rule out a myelopathy that could be causing the myoclonus that she experiences at night. 2.   Dextroamphetamine 10 mg po bid prn.   3.   Continue lamotrigine for dysesthesias 4.   Clonazepam at bedtime and Paxil 5.  Use cane for gait.  6.   She will return to see me in 5 months or sooner if she has new or worsening neurologic symptoms.  40 minutes face-to-face evaluation with greater than one half the time counseling and coordinating care about her MS and related symptoms.  Richard A. Epimenio Foot, MD, PhD 03/05/2016, 3:31 PM Certified in Neurology, Clinical Neurophysiology, Sleep Medicine, Pain Medicine and Neuroimaging  [facility] [address], [address] [place], [place] [address] [phone]

## 2016-03-05 NOTE — Telephone Encounter (Signed)
I have spoken with Jill Rogers this afternoon.  She c/o worsening tremor in hands, esp. when holding objects, worsening gait, increased falls.  Will come in today at 3pm to see RAS/fim

## 2016-03-25 ENCOUNTER — Other Ambulatory Visit: Payer: Self-pay | Admitting: Neurology

## 2016-03-27 ENCOUNTER — Ambulatory Visit
Admission: RE | Admit: 2016-03-27 | Discharge: 2016-03-27 | Disposition: A | Discharge status: Home / Self Care | Payer: Medicare Other | Source: Ambulatory Visit | Attending: Neurology | Admitting: Neurology

## 2016-03-27 DIAGNOSIS — G35 Multiple sclerosis: Secondary | ICD-10-CM

## 2016-03-27 DIAGNOSIS — R269 Unspecified abnormalities of gait and mobility: Secondary | ICD-10-CM

## 2016-03-27 DIAGNOSIS — G253 Myoclonus: Secondary | ICD-10-CM

## 2016-03-27 DIAGNOSIS — R251 Tremor, unspecified: Secondary | ICD-10-CM

## 2016-03-27 MED ORDER — GADOBENATE DIMEGLUMINE 529 MG/ML IV SOLN
17.0000 mL | Freq: Once | INTRAVENOUS | Status: AC | PRN
  Administered 2016-03-27: 17 mL via INTRAVENOUS

## 2016-03-31 ENCOUNTER — Telehealth: Payer: Self-pay | Admitting: *Deleted

## 2016-03-31 NOTE — Telephone Encounter (Signed)
I have spoken with Issa today and per RAS, reviewed MRI results as below.  She verbalized understanding of same/fim

## 2016-03-31 NOTE — Telephone Encounter (Signed)
-----   Message from Richard A Sater, MD sent at 03/29/2016  4:05 PM EST ----- Please let her know that the MRI of the brain does not show any changes compared to the last one.  The MRI of the cervical spine does show degenerative changes at C4-C5, C5-C6 and C6-C7. This could cause some neck pain and arm pain does not appear to be bad enough for surgery.   There is nothing that would explain the jerking that occurs.   There do not appear to be any MS lesions within the spinal cord. 

## 2016-03-31 NOTE — Telephone Encounter (Signed)
I have spoken with Jill Rogers this morning and per RAS,. reviewed MRI results as below.  She verbalized understanding of same/fim

## 2016-03-31 NOTE — Telephone Encounter (Signed)
-----   Message from Asa Lente, MD sent at 03/29/2016  4:05 PM EST ----- Please let her know that the MRI of the brain does not show any changes compared to the last one.  The MRI of the cervical spine does show degenerative changes at C4-C5, C5-C6 and C6-C7. This could cause some neck pain and arm pain does not appear to be bad enough for surgery.   There is nothing that would explain the jerking that occurs.   There do not appear to be any MS lesions within the spinal cord.

## 2016-04-17 DIAGNOSIS — Z8542 Personal history of malignant neoplasm of other parts of uterus: Secondary | ICD-10-CM | POA: Insufficient documentation

## 2016-05-07 ENCOUNTER — Telehealth: Payer: Self-pay | Admitting: Neurology

## 2016-05-07 NOTE — Telephone Encounter (Signed)
Patient requesting a letter to be sent to Dr. Selinda Eon @ [facility] giving clearance for the patient to have a hysterectomy on 05-24-16 since the patient has MS. Please fax letter to [phone].

## 2016-05-08 ENCOUNTER — Encounter: Payer: Self-pay | Admitting: *Deleted

## 2016-05-08 NOTE — Telephone Encounter (Signed)
Letter faxed to Dr. Greggory Stallion as requested/fim

## 2016-05-14 ENCOUNTER — Telehealth: Payer: Self-pay | Admitting: Neurology

## 2016-05-14 NOTE — Telephone Encounter (Signed)
Que with [facility] called office in reference to interferon beta-1a (REBIF) 44 MCG/0.5ML SOSY injection states they have tried to contact patient 4 times for refill with no success.  Please call

## 2016-05-14 NOTE — Telephone Encounter (Signed)
I have spoken with pt. at her home #, advised that [facility] has been unable to reach her to set up delivery of her next Rebif shipment.  She sts. she is not returning their calls b/c she still has one mo. of med left.  I have asked that she return [facility]. call to let them know she does not need r/f right now/fim

## 2016-05-15 DIAGNOSIS — G35 Multiple sclerosis: Secondary | ICD-10-CM | POA: Insufficient documentation

## 2016-05-15 DIAGNOSIS — I10 Essential (primary) hypertension: Secondary | ICD-10-CM | POA: Insufficient documentation

## 2016-05-15 DIAGNOSIS — F419 Anxiety disorder, unspecified: Secondary | ICD-10-CM | POA: Insufficient documentation

## 2016-05-15 DIAGNOSIS — F32A Depression, unspecified: Secondary | ICD-10-CM | POA: Insufficient documentation

## 2016-05-15 DIAGNOSIS — G43909 Migraine, unspecified, not intractable, without status migrainosus: Secondary | ICD-10-CM | POA: Insufficient documentation

## 2016-05-15 DIAGNOSIS — Z720 Tobacco use: Secondary | ICD-10-CM | POA: Insufficient documentation

## 2016-07-15 ENCOUNTER — Ambulatory Visit (INDEPENDENT_AMBULATORY_CARE_PROVIDER_SITE_OTHER): Payer: Medicare Other | Admitting: Neurology

## 2016-07-15 ENCOUNTER — Encounter: Payer: Self-pay | Admitting: Neurology

## 2016-07-15 VITALS — BP 134/76 | HR 68 | Resp 18 | Ht 63.0 in | Wt 201.0 lb

## 2016-07-15 DIAGNOSIS — G35 Multiple sclerosis: Secondary | ICD-10-CM

## 2016-07-15 DIAGNOSIS — R208 Other disturbances of skin sensation: Secondary | ICD-10-CM

## 2016-07-15 DIAGNOSIS — R269 Unspecified abnormalities of gait and mobility: Secondary | ICD-10-CM

## 2016-07-15 DIAGNOSIS — Z79899 Other long term (current) drug therapy: Secondary | ICD-10-CM | POA: Diagnosis not present

## 2016-07-15 DIAGNOSIS — N3946 Mixed incontinence: Secondary | ICD-10-CM

## 2016-07-15 DIAGNOSIS — E559 Vitamin D deficiency, unspecified: Secondary | ICD-10-CM

## 2016-07-15 DIAGNOSIS — F339 Major depressive disorder, recurrent, unspecified: Secondary | ICD-10-CM

## 2016-07-15 MED ORDER — DEXTROAMPHETAMINE SULFATE 10 MG PO TABS: 10.0000 mg | ORAL_TABLET | Freq: Every day | ORAL | 0 refills | Status: DC

## 2016-07-15 MED ORDER — AMANTADINE HCL 100 MG PO TABS: 100.0000 mg | ORAL_TABLET | Freq: Two times a day (BID) | ORAL | 3 refills | Status: DC

## 2016-07-15 MED ORDER — PAROXETINE HCL 40 MG PO TABS: 40.0000 mg | ORAL_TABLET | Freq: Every day | ORAL | 4 refills | Status: DC

## 2016-07-15 MED ORDER — LAMOTRIGINE 100 MG PO TABS
100.0000 mg | ORAL_TABLET | Freq: Two times a day (BID) | ORAL | 3 refills | Status: DC
Start: 2016-07-15 — End: 2017-04-28

## 2016-07-15 NOTE — Progress Notes (Signed)
[facility]  PATIENT: Jill Rogers DOB: 11/28/1955  REFERRING DOCTOR OR PCP:  None SOURCE: patient  _________________________________   HISTORICAL  CHIEF COMPLAINT:  Chief Complaint  Patient presents with  . Multiple Sclerosis    Sts. she continues to tolerate Rebif well.  Believes balance is worse--is falling more.  Sts. tremor in left hand and left foot are almost resolved. Since last ov, she has had a complete hysterectomy endometrial cancer, and following surgery, had a surgical abscess that is still healing, and e.coli/fim    HISTORY OF PRESENT ILLNESS:  Jill Rogers is a 61 year old woman with multiple sclerosis.   She was recently diagnosed with COPD and is on a daily inhaler.  She also had an abnormal Echo and is being referred to cardiology.  MS:   She notes more issues with falls recently.  No definite exacerbation. .    She is on Rebif and is tolerating it well.   MRIs were stable.    I reviewed the recent MRI's Of the brain and spinal cord from 03/27/2016. Images were personally reviewed.  The MRI of the cervical spine shows a normal spinal cord. There are degenerative changes at C4-C5 encroaching upon both of the exiting C5 nerve root, at C5-C6 encroaching upon the left C6 nerve root and at C6-C7 causing mild spinal stenosis. The MRI of the brain shows multiple T2/FLAIR hyperintense foci consistent with MS. There have been no changes since 05/06/2014 MRI.  Gait/Strength/sensation:  Gait is poor due to reduce balance.   She has recent fall injuring her left leg. This occurred while she was outside doing gardening.  Usually the left leg is better than her right leg.    She needs a cane to walk safely.    She tires out easily with walking.   Legs feel weaker than her arms.     She has some spasticity.   She denies arm weakness or spasticity.      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      Lamotrigine is helping  dysesthesia.   Lyrica was stopped due to weight gain.  .    Vision:   She is noting diplopia at times, noting it more when watching TV and when tired. .   It lasts just 5-30 seconds when it occurs.    She sometimes has diplopia while driving or looking at TV (sitting or laying down) or when tired   She also needs reading glasses       Bladder:   SInce her hysterectomy, she notes more urinary urgency.      She has urinary frequency and urgency and if she can't get to bathroom in time, she has rare incontinence.   Years ago, she had a sling operation which greatly helped the urgency and incontinence.     Fatigue/Sleep:   She reports fatigue that is physical > cognitive.    She also has sleepiness.   Dextroamphetamine helped the sleepiness than the fatigue.    Heat and fever worsen her fatigue.     She feels sleepiness is better after a nap.  She sometimes has trouble with sleep onset since the jerks started.   In the past, she had a PSG. She does not have obstructive sleep apnea.   Mood/Cognition:    She feels mood is stable.   She has mild depression but nothing significant.      She  is on Paxil 40 mg but still notes depression.    She takes clonazepam when out of the house around other people.    Other:   She had multiple abscesses and was seeing dermatology.  These are better  MS History:  She was diagnosed in 1983 after she presented with right hand weakness and a limp.   She had an MRI consistent with MS and she did not need to have a LP.    Those symptoms improved after a few weeks.     She was initially placed on Symmetrel for her fatigue and received some steroids (oral or IV) several times.    She was started on Avonex in 1994.   She switched to Rebif 5 years ago after an exacerbation treated with IV solu-medrol.    Also during that time, her husband left and she was under a lot of stress.    She started seeing me around 2013.  Her last exacerbation was 2014 with some gait issues.   She tolerates  Rebif well but has had some bruising at times.         REVIEW OF SYSTEMS: Constitutional: No fevers, chills, sweats, or change in appetite.   Fatigue.   She reports excessive sleepines Eyes: No visual changes.   Notes  double vision.  No eye pain Ear, nose and throat: No hearing loss, ear pain, nasal congestion, sore throat.  Recent ear infection Cardiovascular: No chest pain, palpitations Respiratory: No shortness of breath at rest or with exertion.   No wheezes GastrointestinaI: No nausea, vomiting, diarrhea, abdominal pain, fecal incontinence Genitourinary:see above Musculoskeletal: Mild neck pain, back pain.   Muscles stiff Integumentary: No rash, pruritus.   Has boils/abscesses Neurological: as above Psychiatric: see above Endocrine: No palpitations, diaphoresis, change in appetite, change in weigh or increased thirst Hematologic/Lymphatic: No anemia, petechiae.   Bruises easily at injeciton sites Allergic/Immunologic: No itchy/runny eyes, nasal congestion, recent allergic reactions, rashes  ALLERGIES: Allergies  Allergen Reactions  . Hydrocodone Nausea And Vomiting  . Oxycodone Nausea And Vomiting  . Tape     bandaids rip her skin/fim    HOME MEDICATIONS:  Current Outpatient Prescriptions:  .  acidophilus (RISAQUAD) CAPS capsule, Take 1 capsule by mouth daily., Disp: , Rfl:  .  Amantadine HCl 100 MG tablet, Take 1 tablet (100 mg total) by mouth 2 (two) times daily., Disp: 180 tablet, Rfl: 3 .  aspirin 81 MG tablet, Take 81 mg by mouth daily., Disp: , Rfl:  .  Biotin 5000 MCG CAPS, Take 5,000 mcg by mouth 2 (two) times daily at 8 am and 10 pm., Disp: , Rfl:  .  clonazePAM (KLONOPIN) 0.5 MG tablet, Take 1 tablet (0.5 mg total) by mouth 2 (two) times daily as needed for anxiety., Disp: 60 tablet, Rfl: 4 .  co-enzyme Q-10 30 MG capsule, Take 30 mg by mouth 3 (three) times daily., Disp: , Rfl:  .  dextroamphetamine (DEXTROSTAT) 10 MG tablet, Take 1 tablet (10 mg total) by  mouth daily. Take one to two tablets once daily as needed., Disp: 60 tablet, Rfl: 0 .  hydrochlorothiazide (HYDRODIURIL) 25 MG tablet, Take 25 mg by mouth daily., Disp: , Rfl:  .  indomethacin (INDOCIN) 25 MG capsule, One po tid with food as needed, Disp: 90 capsule, Rfl: 1 .  interferon beta-1a (REBIF) 44 MCG/0.5ML SOSY injection, Inject 0.5 mLs (44 mcg total) into the skin 3 (three) times a week., Disp: 36 Syringe, Rfl: 3 .  Iron-Vitamins (GERITOL COMPLETE) TABS, Take by mouth daily., Disp: , Rfl:  .  lamoTRIgine (LAMICTAL) 100 MG tablet, Take 1 tablet (100 mg total) by mouth 2 (two) times daily., Disp: 180 tablet, Rfl: 3 .  metoprolol succinate (TOPROL XL) 25 MG 24 hr tablet, Take 1 tablet (25 mg total) by mouth daily., Disp: 30 tablet, Rfl: 5 .  oxyCODONE (ROXICODONE) 5 MG immediate release tablet, Take 1 tablet (5 mg total) by mouth every 6 (six) hours as needed for severe pain., Disp: 30 tablet, Rfl: 0 .  PARoxetine (PAXIL) 40 MG tablet, Take 1 tablet (40 mg total) by mouth daily., Disp: 90 tablet, Rfl: 4 .  PROAIR HFA 108 (90 Base) MCG/ACT inhaler, , Disp: , Rfl:  .  Specialty Vitamins Products (MAGNESIUM, AMINO ACID CHELATE,) 133 MG tablet, Take 1 tablet by mouth 2 (two) times daily., Disp: , Rfl:  .  UNABLE TO FIND, Ultimate Flora Probiotic 25 billion once daily/fim, Disp: , Rfl:  .  vitamin C (ASCORBIC ACID) 500 MG tablet, Take 500 mg by mouth daily., Disp: , Rfl:  .  Vitamin D, Cholecalciferol, 1000 UNITS CAPS, Take 2,000 Units by mouth daily., Disp: , Rfl:   PAST MEDICAL HISTORY: Past Medical History:  Diagnosis Date  . Movement disorder   . Multiple sclerosis (HCC)   . Vision abnormalities     PAST SURGICAL HISTORY: Past Surgical History:  Procedure Laterality Date  . CERVICAL CONE BIOPSY    . CHOLECYSTECTOMY, LAPAROSCOPIC      FAMILY HISTORY: Family History  Problem Relation Age of Onset  . Diabetes type II Mother   . Arthritis Mother   . Heart failure Father   .  Diabetes type II Father     SOCIAL HISTORY:  Social History   Social History  . Marital status: Divorced    Spouse name: N/A  . Number of children: N/A  . Years of education: N/A   Occupational History  . Not on file.   Social History Main Topics  . Smoking status: Current Every Day Smoker    Packs/day: 0.50    Types: Cigarettes  . Smokeless tobacco: Never Used  . Alcohol use 0.0 oz/week     Comment: occasional/fim  . Drug use: No  . Sexual activity: Not on file   Other Topics Concern  . Not on file   Social History Narrative  . No narrative on file     PHYSICAL EXAM  Vitals:   07/15/16 1133  BP: 134/76  Pulse: 68  Resp: 18  Weight: 201 lb (91.2 kg)  Height: 5\' 3"  (1.6 m)    Body mass index is 35.61 kg/m.   General: The patient is well-developed and well-nourished and in no acute distress   Neurologic Exam  Mental status: The patient is alert and oriented x 3 at the time of the examination. The patient has apparent normal recent and remote memory, with an apparently normal attention span and concentration ability.   Speech is normal.  Cranial nerves: Extraocular movements are full. Facial strength and sensation is normal. Trapezius and sternocleidomastoid strength strength is normal. The tongue is midline, and the patient has symmetric elevation of the soft palate. No obvious hearing deficits are noted.  Motor:  Muscle bulk is normal.   Tone is slightly increased in right leg. Strength is  5 / 5 in all 4 extremities.   Sensory: She has intact sensation to touch and vibration in the arms and the leg except for  decreased touch/scratch in right anterolateral thigh  Coordination: Cerebellar testing reveals good finger-nose-finger  .  Gait and station: Station is normal.   Her gait is wide. The tandem gait is poor.. Romberg is negative.   Reflexes: Deep tendon reflexes are normal in the arms but mildly  increased in right leg.       DIAGNOSTIC DATA  (LABS, IMAGING, TESTING) - I reviewed patient records, labs, notes, testing and imaging myself where available.     ASSESSMENT AND PLAN  Multiple sclerosis (HCC)  Dysesthesia  Gait disturbance  Recurrent major depressive episodes (HCC)  Mixed stress and urge urinary incontinence  High risk medication use  Vitamin D deficiency   1.  She will continue Rebif injections for her MS.   Continue vitamin D supplements 2.   Dextroamphetamine 10 mg po bid prn.   3.   Lamotrigine for dysesthesias 4.   Clonazepam at bedtime and Paxil 5.  She is advised to use a cane for her gait and be extra careful on uneven surfaces.   6.   She will return to see me in 5 months or sooner if she has new or worsening neurologic symptoms.  40 minutes face-to-face evaluation with greater than one half the time counseling and coordinating care about her MS and related symptoms.  Richard A. Epimenio Foot, MD, PhD 07/15/2016, 6:32 PM Certified in Neurology, Clinical Neurophysiology, Sleep Medicine, Pain Medicine and Neuroimaging  [facility] [address], [address] [place], [place] [address] [phone]

## 2016-12-25 DIAGNOSIS — R928 Other abnormal and inconclusive findings on diagnostic imaging of breast: Secondary | ICD-10-CM | POA: Insufficient documentation

## 2017-01-11 DIAGNOSIS — N63 Unspecified lump in unspecified breast: Secondary | ICD-10-CM | POA: Insufficient documentation

## 2017-01-12 ENCOUNTER — Other Ambulatory Visit: Payer: Self-pay | Admitting: Surgical Oncology

## 2017-01-12 DIAGNOSIS — N6310 Unspecified lump in the right breast, unspecified quadrant: Secondary | ICD-10-CM

## 2017-01-14 ENCOUNTER — Ambulatory Visit: Payer: Medicare Other | Admitting: Neurology

## 2017-01-14 ENCOUNTER — Other Ambulatory Visit: Payer: Self-pay

## 2017-01-14 ENCOUNTER — Encounter: Payer: Self-pay | Admitting: Neurology

## 2017-01-14 VITALS — BP 114/62 | HR 68 | Resp 18 | Ht 63.0 in | Wt 201.5 lb

## 2017-01-14 DIAGNOSIS — G4719 Other hypersomnia: Secondary | ICD-10-CM | POA: Diagnosis not present

## 2017-01-14 DIAGNOSIS — F418 Other specified anxiety disorders: Secondary | ICD-10-CM | POA: Diagnosis not present

## 2017-01-14 DIAGNOSIS — N3946 Mixed incontinence: Secondary | ICD-10-CM | POA: Diagnosis not present

## 2017-01-14 DIAGNOSIS — G35 Multiple sclerosis: Secondary | ICD-10-CM | POA: Diagnosis not present

## 2017-01-14 DIAGNOSIS — Z79899 Other long term (current) drug therapy: Secondary | ICD-10-CM

## 2017-01-14 DIAGNOSIS — R251 Tremor, unspecified: Secondary | ICD-10-CM

## 2017-01-14 DIAGNOSIS — R208 Other disturbances of skin sensation: Secondary | ICD-10-CM | POA: Diagnosis not present

## 2017-01-14 MED ORDER — DULOXETINE HCL 60 MG PO CPEP: 60.0000 mg | ORAL_CAPSULE | Freq: Every day | ORAL | 11 refills | Status: DC

## 2017-01-14 MED ORDER — DEXTROAMPHETAMINE SULFATE 10 MG PO TABS: ORAL_TABLET | ORAL | 0 refills | Status: DC

## 2017-01-14 NOTE — Progress Notes (Signed)
[facility]  PATIENT: Jill Rogers DOB: 1955-11-18  REFERRING DOCTOR OR PCP:  None SOURCE: patient  _________________________________   HISTORICAL  CHIEF COMPLAINT:  Chief Complaint  Patient presents with  . Multiple Sclerosis    Sts. she continues to tolerate Rebif well, but often forgets her injection--misses at least 1 inj. each wk.  Since last ov, she had a complete hysterectomy due to endometrial cancer/fim    HISTORY OF PRESENT ILLNESS:  Jill Rogers is a 61 year old woman with multiple sclerosis.     Update 01/14/2017:    She feels her MS is worse.   She does not have any new exacerbations but her symptoms seem worse.    She notes her tremor is more intense now.   Her gait is slowed and she relies on her cane more.   She notes more fatigue.   She tries to do some exercise daily, especially walking. She did have one fall when she turned on a step and fell, hitting her head (no LOC and she had CT in ED)  She feels more forgetful than earlier this year.  She is on Rebif but misses almost 1/3 the injections.  She is tired of shots and we dicussed pills.    Her brain MRI 03/2016 was unchanged compared to 2016 nd the MRI cervical spine showed DJD but no spinal cord plaques.     She notes more depression and is tearful about times, feeling stressed due to her medical issues.   She sometimes has insomnia but also has trouble getting out of bed some days.   She is on Paxil 40 mg and lamotrigine.      Her mammogram was abnormal.   Initial breast biopsies were benign.   She is going to need another breast biopsy tomorrow.   She has a colonoscopy next week.   She had a hysterectomy for endometrial cancer.    From 07/15/2016: She was recently diagnosed with COPD and is on a daily inhaler.  She also had an abnormal Echo and is being referred to cardiology.  MS:   She notes more issues with falls recently.  No definite exacerbation. .    She is on Rebif and is tolerating  it well.   MRIs were stable.    I reviewed the recent MRI's Of the brain and spinal cord from 03/27/2016. Images were personally reviewed.  The MRI of the cervical spine shows a normal spinal cord. There are degenerative changes at C4-C5 encroaching upon both of the exiting C5 nerve root, at C5-C6 encroaching upon the left C6 nerve root and at C6-C7 causing mild spinal stenosis. The MRI of the brain shows multiple T2/FLAIR hyperintense foci consistent with MS. There have been no changes since 05/06/2014 MRI.  Gait/Strength/sensation:  Gait is poor due to reduce balance.   She has recent fall injuring her left leg. This occurred while she was outside doing gardening.  Usually the left leg is better than her right leg.    She needs a cane to walk safely.    She tires out easily with walking.   Legs feel weaker than her arms.     She has some spasticity.   She denies arm weakness or spasticity.      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      Lamotrigine is helping dysesthesia.   Lyrica was stopped due to weight gain.  Marland Kitchen  Vision:   She is noting diplopia at times, noting it more when watching TV and when tired. .   It lasts just 5-30 seconds when it occurs.    She sometimes has diplopia while driving or looking at TV (sitting or laying down) or when tired   She also needs reading glasses       Bladder:   SInce her hysterectomy, she notes more urinary urgency.      She has urinary frequency and urgency and if she can't get to bathroom in time, she has rare incontinence.   Years ago, she had a sling operation which greatly helped the urgency and incontinence.     Fatigue/Sleep:   She reports fatigue that is physical > cognitive.    She also has sleepiness.   Dextroamphetamine helped the sleepiness than the fatigue.    Heat and fever worsen her fatigue.     She feels sleepiness is better after a nap.  She sometimes has trouble with sleep onset since the jerks started.    In the past, she had a PSG. She does not have obstructive sleep apnea.   Mood/Cognition:    She feels mood is stable.   She has mild depression but nothing significant.      She is on Paxil 40 mg but still notes depression.    She takes clonazepam when out of the house around other people.    Other:   She had multiple abscesses and was seeing dermatology.  These are better  MS History:  She was diagnosed in 1983 after she presented with right hand weakness and a limp.   She had an MRI consistent with MS and she did not need to have a LP.    Those symptoms improved after a few weeks.     She was initially placed on Symmetrel for her fatigue and received some steroids (oral or IV) several times.    She was started on Avonex in 1994.   She switched to Rebif 5 years ago after an exacerbation treated with IV solu-medrol.    Also during that time, her husband left and she was under a lot of stress.    She started seeing me around 2013.  Her last exacerbation was 2014 with some gait issues.   She tolerates Rebif well but has had some bruising at times.         REVIEW OF SYSTEMS: Constitutional: No fevers, chills, sweats, or change in appetite.   Fatigue.   She reports excessive sleepines Eyes: No visual changes.   Notes  double vision.  No eye pain Ear, nose and throat: No hearing loss, ear pain, nasal congestion, sore throat.  Recent ear infection Cardiovascular: No chest pain, palpitations Respiratory: No shortness of breath at rest or with exertion.   No wheezes GastrointestinaI: No nausea, vomiting, diarrhea, abdominal pain, fecal incontinence Genitourinary:see above Musculoskeletal: Mild neck pain, back pain.   Muscles stiff Integumentary: No rash, pruritus.   Has boils/abscesses Neurological: as above Psychiatric: see above Endocrine: No palpitations, diaphoresis, change in appetite, change in weigh or increased thirst Hematologic/Lymphatic: No anemia, petechiae.   Bruises easily at  injeciton sites Allergic/Immunologic: No itchy/runny eyes, nasal congestion, recent allergic reactions, rashes  ALLERGIES: Allergies  Allergen Reactions  . Hydrocodone Nausea And Vomiting  . Oxycodone Nausea And Vomiting  . Tape     bandaids rip her skin/fim    HOME MEDICATIONS:  Current Outpatient Medications:  .  acidophilus (RISAQUAD) CAPS capsule,  Take 1 capsule by mouth daily., Disp: , Rfl:  .  Amantadine HCl 100 MG tablet, Take 1 tablet (100 mg total) by mouth 2 (two) times daily., Disp: 180 tablet, Rfl: 3 .  aspirin 81 MG tablet, Take 81 mg by mouth daily., Disp: , Rfl:  .  Biotin 5000 MCG CAPS, Take 5,000 mcg by mouth 2 (two) times daily at 8 am and 10 pm., Disp: , Rfl:  .  clonazePAM (KLONOPIN) 0.5 MG tablet, Take 1 tablet (0.5 mg total) by mouth 2 (two) times daily as needed for anxiety., Disp: 60 tablet, Rfl: 4 .  co-enzyme Q-10 30 MG capsule, Take 30 mg by mouth 3 (three) times daily., Disp: , Rfl:  .  dextroamphetamine (DEXTROSTAT) 10 MG tablet, Take up to 3 tablets daily as needed., Disp: 90 tablet, Rfl: 0 .  hydrochlorothiazide (HYDRODIURIL) 25 MG tablet, Take 25 mg by mouth daily., Disp: , Rfl:  .  indomethacin (INDOCIN) 25 MG capsule, One po tid with food as needed, Disp: 90 capsule, Rfl: 1 .  interferon beta-1a (REBIF) 44 MCG/0.5ML SOSY injection, Inject 0.5 mLs (44 mcg total) into the skin 3 (three) times a week., Disp: 36 Syringe, Rfl: 3 .  Iron-Vitamins (GERITOL COMPLETE) TABS, Take by mouth daily., Disp: , Rfl:  .  lamoTRIgine (LAMICTAL) 100 MG tablet, Take 1 tablet (100 mg total) by mouth 2 (two) times daily., Disp: 180 tablet, Rfl: 3 .  metoprolol succinate (TOPROL XL) 25 MG 24 hr tablet, Take 1 tablet (25 mg total) by mouth daily., Disp: 30 tablet, Rfl: 5 .  oxyCODONE (ROXICODONE) 5 MG immediate release tablet, Take 1 tablet (5 mg total) by mouth every 6 (six) hours as needed for severe pain., Disp: 30 tablet, Rfl: 0 .  PROAIR HFA 108 (90 Base) MCG/ACT inhaler, ,  Disp: , Rfl:  .  Specialty Vitamins Products (MAGNESIUM, AMINO ACID CHELATE,) 133 MG tablet, Take 1 tablet by mouth 2 (two) times daily., Disp: , Rfl:  .  UNABLE TO FIND, Ultimate Flora Probiotic 25 billion once daily/fim, Disp: , Rfl:  .  vitamin C (ASCORBIC ACID) 500 MG tablet, Take 500 mg by mouth daily., Disp: , Rfl:  .  Vitamin D, Cholecalciferol, 1000 UNITS CAPS, Take 2,000 Units by mouth daily., Disp: , Rfl:  .  DULoxetine (CYMBALTA) 60 MG capsule, Take 1 capsule (60 mg total) by mouth daily., Disp: 30 capsule, Rfl: 11  PAST MEDICAL HISTORY: Past Medical History:  Diagnosis Date  . Movement disorder   . Multiple sclerosis (HCC)   . Vision abnormalities     PAST SURGICAL HISTORY: Past Surgical History:  Procedure Laterality Date  . CERVICAL CONE BIOPSY    . CHOLECYSTECTOMY, LAPAROSCOPIC      FAMILY HISTORY: Family History  Problem Relation Age of Onset  . Diabetes type II Mother   . Arthritis Mother   . Heart failure Father   . Diabetes type II Father     SOCIAL HISTORY:  Social History   Socioeconomic History  . Marital status: Divorced    Spouse name: Not on file  . Number of children: Not on file  . Years of education: Not on file  . Highest education level: Not on file  Social Needs  . Financial resource strain: Not on file  . Food insecurity - worry: Not on file  . Food insecurity - inability: Not on file  . Transportation needs - medical: Not on file  . Transportation needs - non-medical: Not on  file  Occupational History  . Not on file  Tobacco Use  . Smoking status: Current Every Day Smoker    Packs/day: 0.50    Types: Cigarettes  . Smokeless tobacco: Never Used  Substance and Sexual Activity  . Alcohol use: Yes    Alcohol/week: 0.0 oz    Comment: occasional/fim  . Drug use: No  . Sexual activity: Not on file  Other Topics Concern  . Not on file  Social History Narrative  . Not on file     PHYSICAL EXAM  Vitals:   01/14/17 1302  BP:  114/62  Pulse: 68  Resp: 18  Weight: 201 lb 8 oz (91.4 kg)  Height: 5\' 3"  (1.6 m)    Body mass index is 35.69 kg/m.   General: The patient is well-developed and well-nourished and in no acute distress   Neurologic Exam  Mental status: The patient is alert and oriented x 3 at the time of the examination. The patient has apparent normal recent and remote memory, with an apparently normal attention span and concentration ability.   Speech is normal.  Cranial nerves: Extraocular movements are full. Facial strength and sensation is normal. Trapezius and sternocleidomastoid strength strength is normal. The tongue is midline, and the patient has symmetric elevation of the soft palate. No obvious hearing deficits are noted.  Motor:  Muscle bulk is normal.   Tone is slightly increased in right leg. Strength is  5 / 5 in all 4 extremities.   Sensory: She has intact sensation to touch and vibration in the arms and the leg except for decreased touch/scratch in right anterolateral thigh  Coordination: Cerebellar testing reveals good finger-nose-finger  .  Gait and station: Station is normal.   Her gait is wide. The tandem gait is poor.. Romberg is negative.   Reflexes: Deep tendon reflexes are normal in the arms but mildly  increased in right leg.       DIAGNOSTIC DATA (LABS, IMAGING, TESTING) - I reviewed patient records, labs, notes, testing and imaging myself where available.     ASSESSMENT AND PLAN  Multiple sclerosis (HCC)  Depression with anxiety  Dysesthesia  Excessive daytime sleepiness  High risk medication use  Mixed stress and urge urinary incontinence  Tremor   1.   She will switch from Rebif to Tecfidera. We went over the risks and benefits..   Continue vitamin D supplements 2.   Renew Dextroamphetamine 10 mg po bid prn.   3.   Continue Lamotrigine for dysesthesias 4.    Change Paxil to duloxetine. Continue clonazepam at bedtime. 5.  She is advised to use a  cane for her gait and be extra careful on uneven surfaces.   6.   She will return to see me in 5 months or sooner if she has new or worsening neurologic symptoms.   Briseidy Spark A. Epimenio FootSater, MD, PhD 01/14/2017, 1:41 PM Certified in Neurology, Clinical Neurophysiology, Sleep Medicine, Pain Medicine and Neuroimaging  [facility] [address], [address] [place], [place] [id number] [phone]

## 2017-01-15 ENCOUNTER — Ambulatory Visit
Admission: RE | Admit: 2017-01-15 | Discharge: 2017-01-15 | Disposition: A | Discharge status: Home / Self Care | Payer: Medicare Other | Source: Ambulatory Visit | Attending: Surgical Oncology | Admitting: Surgical Oncology

## 2017-01-15 DIAGNOSIS — N6310 Unspecified lump in the right breast, unspecified quadrant: Secondary | ICD-10-CM

## 2017-03-02 ENCOUNTER — Telehealth: Payer: Self-pay | Admitting: Neurology

## 2017-03-02 NOTE — Telephone Encounter (Signed)
Spoke with Rosey Bath.  Tecfidera srf has been faxed to [facility] today.  She is aware to expect a call from them w/i the next 1-2 business days/fim

## 2017-03-02 NOTE — Telephone Encounter (Signed)
Pt is wanting a call to discuss interferon beta-1a (REBIF) 44 MCG/0.5ML SOSY injection she only has a weeks supply left and is wanting to switch to an oral tablet if possible. Please contact to discuss options at [phone] or [phone]

## 2017-03-09 ENCOUNTER — Telehealth: Payer: Self-pay | Admitting: Neurology

## 2017-03-09 NOTE — Telephone Encounter (Signed)
Molli Hazard from [facility] is asking for a call re: faxes that have been sent here to [facility] for pt's interferon beta-1a (REBIF) 44 MCG/0.5ML SOSY injection he is asking for a call back to [phone]

## 2017-03-20 NOTE — Telephone Encounter (Signed)
See last TE, patient switched to tecfidera

## 2017-03-23 ENCOUNTER — Telehealth: Payer: Self-pay | Admitting: Neurology

## 2017-03-23 NOTE — Telephone Encounter (Signed)
Pt. is no longer on Rebif.  If the pharmacy calls back, please let them know that. Thanks!

## 2017-03-23 NOTE — Telephone Encounter (Signed)
Amy/[facility] [phone] request refill for interferon beta-1a (REBIF) 44 MCG/0.5ML SOSY injection.

## 2017-04-28 ENCOUNTER — Telehealth: Payer: Self-pay | Admitting: Neurology

## 2017-04-28 MED ORDER — LAMOTRIGINE 100 MG PO TABS: 100.0000 mg | ORAL_TABLET | Freq: Two times a day (BID) | ORAL | 3 refills | Status: DC

## 2017-04-28 MED ORDER — AMANTADINE HCL 100 MG PO TABS: 100.0000 mg | ORAL_TABLET | Freq: Two times a day (BID) | ORAL | 3 refills | Status: DC

## 2017-04-28 NOTE — Telephone Encounter (Signed)
New Insurance card [employer])  Name SURAYA VIDRINE  Member I.D. [facility] Rx Vin # [phone] Group # [id number]  Pt requesting a refill for Amantadine HCl 100 MG tablet and lamoTRIgine (LAMICTAL) 100 MG tablet to be sent through [facility] order.Also wanted to inform Dr. Epimenio Foot that she started Tecfidera last month and has had no s/e

## 2017-04-28 NOTE — Telephone Encounter (Signed)
Noted that pt. has started Tecfidera.  She has a pending f/u 07/15/17.  Lamictal and Amantadine escribed to [facility] order as requested/fim

## 2017-05-18 ENCOUNTER — Telehealth: Payer: Self-pay | Admitting: Neurology

## 2017-05-18 MED ORDER — DULOXETINE HCL 60 MG PO CPEP: 60.0000 mg | ORAL_CAPSULE | Freq: Every day | ORAL | 3 refills | Status: DC

## 2017-05-18 NOTE — Addendum Note (Signed)
Addended by: Candis Schatz I on: 05/18/2017 01:42 PM   Modules accepted: Orders

## 2017-05-18 NOTE — Telephone Encounter (Signed)
Spoke with Rosey Bath. Paxil was d/c and Cymbalta started at her 01/14/17 appt.  She is not currently taking Paxil.  She verbalized understanding of same, will not restart Paxil.  90 day rx. of Cymbalta escribed to [facility]

## 2017-05-18 NOTE — Telephone Encounter (Signed)
Pt states that she now has new insurance with  [employer] Premium [employer] PPO Member [id number]   [phone] RX [phone] RX Group#[id number] P[facility] LinePh#[phone] Pt is asking for a refill of her Paroextine 40 mg(Paxil) to be called into the mail order company and a 30 day supply to be called into [facility] on [address] in [place]

## 2017-07-15 ENCOUNTER — Other Ambulatory Visit: Payer: Self-pay

## 2017-07-15 ENCOUNTER — Ambulatory Visit (INDEPENDENT_AMBULATORY_CARE_PROVIDER_SITE_OTHER): Payer: Medicare HMO | Admitting: Neurology

## 2017-07-15 ENCOUNTER — Encounter: Payer: Self-pay | Admitting: Neurology

## 2017-07-15 VITALS — BP 125/79 | HR 89 | Resp 18 | Ht 63.0 in | Wt 189.0 lb

## 2017-07-15 DIAGNOSIS — F418 Other specified anxiety disorders: Secondary | ICD-10-CM

## 2017-07-15 DIAGNOSIS — R5383 Other fatigue: Secondary | ICD-10-CM | POA: Diagnosis not present

## 2017-07-15 DIAGNOSIS — G35 Multiple sclerosis: Secondary | ICD-10-CM

## 2017-07-15 DIAGNOSIS — Z79899 Other long term (current) drug therapy: Secondary | ICD-10-CM

## 2017-07-15 DIAGNOSIS — R269 Unspecified abnormalities of gait and mobility: Secondary | ICD-10-CM

## 2017-07-15 MED ORDER — CLONAZEPAM 0.5 MG PO TABS: 0.5000 mg | ORAL_TABLET | Freq: Two times a day (BID) | ORAL | 5 refills | Status: DC | PRN

## 2017-07-15 MED ORDER — DEXTROAMPHETAMINE SULFATE 10 MG PO TABS: ORAL_TABLET | ORAL | 0 refills | Status: DC

## 2017-07-15 NOTE — Progress Notes (Signed)
[facility]  PATIENT: Jill Rogers DOB: 04/07/1955  REFERRING DOCTOR OR PCP:  None SOURCE: patient  _________________________________   HISTORICAL  CHIEF COMPLAINT:  Chief Complaint  Patient presents with  . Multiple Sclerosis    Sts. she tolerates Tecfidera well with occasional mild flushing. "I love love love Tecfidera."/fim    HISTORY OF PRESENT ILLNESS:  Jill Rogers is a 62 year old woman with multiple sclerosis.     Update 07/15/2017: She is feeling better in general since switching from Rebif to to Tecfidera.   She feels the best she has helt in 6 years.   She has nit had any exacerbations.   She is walking a little better but still uses the cane to prevent fall.   No falls since starting Tecfidera.  She tries to avoid stairs and cannot climb a ladder.    Strength is about the same and numbness is rare.     Bladder function is fine.   Vision is fine.  Mood is better since changing meds and changing an SSRI to Cymbalta.  She gets out of the house more and is trying to do some traveling.   Fatigue is much better since the last visit and switching off Rebif.    Sleep is good most nights and she no longer needs naps.  Cognition is about the same, especially word finding difficulty.   She has trouble staying on task.    She does not use the dextroamphetamine daily  Update 01/14/2017:    She feels her MS is worse.   She does not have any new exacerbations but her symptoms seem worse.    She notes her tremor is more intense now.   Her gait is slowed and she relies on her cane more.   She notes more fatigue.   She tries to do some exercise daily, especially walking. She did have one fall when she turned on a step and fell, hitting her head (no LOC and she had CT in ED)  She feels more forgetful than earlier this year.  She is on Rebif but misses almost 1/3 the injections.  She is tired of shots and we dicussed pills.    Her brain MRI 03/2016 was unchanged compared to  2016 nd the MRI cervical spine showed DJD but no spinal cord plaques.     She notes more depression and is tearful about times, feeling stressed due to her medical issues.   She sometimes has insomnia but also has trouble getting out of bed some days.   She is on Paxil 40 mg and lamotrigine.      Her mammogram was abnormal.   Initial breast biopsies were benign.   She is going to need another breast biopsy tomorrow.   She has a colonoscopy next week.   She had a hysterectomy for endometrial cancer.    From 07/15/2016: She was recently diagnosed with COPD and is on a daily inhaler.  She also had an abnormal Echo and is being referred to cardiology.  MS:   She notes more issues with falls recently.  No definite exacerbation. .    She is on Rebif and is tolerating it well.   MRIs were stable.    I reviewed the recent MRI's Of the brain and spinal cord from 03/27/2016. Images were personally reviewed.  The MRI of the cervical spine shows a normal spinal cord. There are degenerative changes at C4-C5 encroaching upon both of the exiting C5  nerve root, at C5-C6 encroaching upon the left C6 nerve root and at C6-C7 causing mild spinal stenosis. The MRI of the brain shows multiple T2/FLAIR hyperintense foci consistent with MS. There have been no changes since 05/06/2014 MRI.  Gait/Strength/sensation:  Gait is poor due to reduce balance.   She has recent fall injuring her left leg. This occurred while she was outside doing gardening.  Usually the left leg is better than her right leg.    She needs a cane to walk safely.    She tires out easily with walking.   Legs feel weaker than her arms.     She has some spasticity.   She denies arm weakness or spasticity.      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      Lamotrigine is helping dysesthesia.   Lyrica was stopped due to weight gain.  .    Vision:   She is noting diplopia at times, noting it more when watching TV and when  tired. .   It lasts just 5-30 seconds when it occurs.    She sometimes has diplopia while driving or looking at TV (sitting or laying down) or when tired   She also needs reading glasses       Bladder:   SInce her hysterectomy, she notes more urinary urgency.      She has urinary frequency and urgency and if she can't get to bathroom in time, she has rare incontinence.   Years ago, she had a sling operation which greatly helped the urgency and incontinence.     Fatigue/Sleep:   She reports fatigue that is physical > cognitive.    She also has sleepiness.   Dextroamphetamine helped the sleepiness than the fatigue.    Heat and fever worsen her fatigue.     She feels sleepiness is better after a nap.  She sometimes has trouble with sleep onset since the jerks started.   In the past, she had a PSG. She does not have obstructive sleep apnea.   Mood/Cognition:    She feels mood is stable.   She has mild depression but nothing significant.      She is on Paxil 40 mg but still notes depression.    She takes clonazepam when out of the house around other people.    Other:   She had multiple abscesses and was seeing dermatology.  These are better  MS History:  She was diagnosed in 1983 after she presented with right hand weakness and a limp.   She had an MRI consistent with MS and she did not need to have a LP.    Those symptoms improved after a few weeks.     She was initially placed on Symmetrel for her fatigue and received some steroids (oral or IV) several times.    She was started on Avonex in 1994.   She switched to Rebif 5 years ago after an exacerbation treated with IV solu-medrol.    Also during that time, her husband left and she was under a lot of stress.    She started seeing me around 2013.  Her last exacerbation was 2014 with some gait issues.   She tolerates Rebif well but has had some bruising at times.         REVIEW OF SYSTEMS: Constitutional: No fevers, chills, sweats, or change in appetite.    Fatigue.   She reports excessive  sleepines Eyes: No visual changes.   Notes  double vision.  No eye pain Ear, nose and throat: No hearing loss, ear pain, nasal congestion, sore throat.  Recent ear infection Cardiovascular: No chest pain, palpitations Respiratory: No shortness of breath at rest or with exertion.   No wheezes GastrointestinaI: No nausea, vomiting, diarrhea, abdominal pain, fecal incontinence Genitourinary:see above Musculoskeletal: Mild neck pain, back pain.   Muscles stiff Integumentary: No rash, pruritus.   Has boils/abscesses Neurological: as above Psychiatric: see above Endocrine: No palpitations, diaphoresis, change in appetite, change in weigh or increased thirst Hematologic/Lymphatic: No anemia, petechiae.   Bruises easily at injeciton sites Allergic/Immunologic: No itchy/runny eyes, nasal congestion, recent allergic reactions, rashes  ALLERGIES: Allergies  Allergen Reactions  . Hydrocodone Nausea And Vomiting  . Oxycodone Nausea And Vomiting  . Tape     bandaids rip her skin/fim    HOME MEDICATIONS:  Current Outpatient Medications:  .  acidophilus (RISAQUAD) CAPS capsule, Take 1 capsule by mouth daily., Disp: , Rfl:  .  Amantadine HCl 100 MG tablet, Take 1 tablet (100 mg total) by mouth 2 (two) times daily., Disp: 180 tablet, Rfl: 3 .  Biotin 5000 MCG CAPS, Take 5,000 mcg by mouth 2 (two) times daily at 8 am and 10 pm., Disp: , Rfl:  .  clonazePAM (KLONOPIN) 0.5 MG tablet, Take 1 tablet (0.5 mg total) by mouth 2 (two) times daily as needed for anxiety., Disp: 60 tablet, Rfl: 5 .  co-enzyme Q-10 30 MG capsule, Take 30 mg by mouth 3 (three) times daily., Disp: , Rfl:  .  dextroamphetamine (DEXTROSTAT) 10 MG tablet, Take up to 3 tablets daily as needed., Disp: 90 tablet, Rfl: 0 .  Dimethyl Fumarate 240 MG CPDR, Take by mouth., Disp: , Rfl:  .  DULoxetine (CYMBALTA) 60 MG capsule, Take 1 capsule (60 mg total) by mouth daily., Disp: 90 capsule, Rfl: 3 .   hydrochlorothiazide (HYDRODIURIL) 25 MG tablet, Take 25 mg by mouth daily., Disp: , Rfl:  .  indomethacin (INDOCIN) 25 MG capsule, One po tid with food as needed, Disp: 90 capsule, Rfl: 1 .  Iron-Vitamins (GERITOL COMPLETE) TABS, Take by mouth daily., Disp: , Rfl:  .  lamoTRIgine (LAMICTAL) 100 MG tablet, Take 1 tablet (100 mg total) by mouth 2 (two) times daily., Disp: 180 tablet, Rfl: 3 .  metoprolol succinate (TOPROL XL) 25 MG 24 hr tablet, Take 1 tablet (25 mg total) by mouth daily., Disp: 30 tablet, Rfl: 5 .  PROAIR HFA 108 (90 Base) MCG/ACT inhaler, , Disp: , Rfl:  .  Specialty Vitamins Products (MAGNESIUM, AMINO ACID CHELATE,) 133 MG tablet, Take 1 tablet by mouth 2 (two) times daily., Disp: , Rfl:  .  UNABLE TO FIND, Ultimate Flora Probiotic 25 billion once daily/fim, Disp: , Rfl:  .  vitamin C (ASCORBIC ACID) 500 MG tablet, Take 500 mg by mouth daily., Disp: , Rfl:  .  Vitamin D, Cholecalciferol, 1000 UNITS CAPS, Take 2,000 Units by mouth daily., Disp: , Rfl:   PAST MEDICAL HISTORY: Past Medical History:  Diagnosis Date  . Movement disorder   . Multiple sclerosis (HCC)   . Vision abnormalities     PAST SURGICAL HISTORY: Past Surgical History:  Procedure Laterality Date  . CERVICAL CONE BIOPSY    . CHOLECYSTECTOMY, LAPAROSCOPIC      FAMILY HISTORY: Family History  Problem Relation Age of Onset  . Diabetes type II Mother   . Arthritis Mother   . Heart failure  Father   . Diabetes type II Father     SOCIAL HISTORY:  Social History   Socioeconomic History  . Marital status: Divorced    Spouse name: Not on file  . Number of children: Not on file  . Years of education: Not on file  . Highest education level: Not on file  Occupational History  . Not on file  Social Needs  . Financial resource strain: Not on file  . Food insecurity:    Worry: Not on file    Inability: Not on file  . Transportation needs:    Medical: Not on file    Non-medical: Not on file   Tobacco Use  . Smoking status: Former Smoker    Packs/day: 0.50    Types: Cigarettes  . Smokeless tobacco: Never Used  Substance and Sexual Activity  . Alcohol use: Yes    Alcohol/week: 0.0 oz    Comment: occasional/fim  . Drug use: No  . Sexual activity: Not on file  Lifestyle  . Physical activity:    Days per week: Not on file    Minutes per session: Not on file  . Stress: Not on file  Relationships  . Social connections:    Talks on phone: Not on file    Gets together: Not on file    Attends religious service: Not on file    Active member of club or organization: Not on file    Attends meetings of clubs or organizations: Not on file    Relationship status: Not on file  . Intimate partner violence:    Fear of current or ex partner: Not on file    Emotionally abused: Not on file    Physically abused: Not on file    Forced sexual activity: Not on file  Other Topics Concern  . Not on file  Social History Narrative  . Not on file     PHYSICAL EXAM  Vitals:   07/15/17 1047  BP: 125/79  Pulse: 89  Resp: 18  Weight: 189 lb (85.7 kg)  Height: 5\' 3"  (1.6 m)    Body mass index is 33.48 kg/m.   General: The patient is well-developed and well-nourished and in no acute distress   Neurologic Exam  Mental status: The patient is alert and oriented x 3 at the time of the examination. The patient has apparent normal recent and remote memory, with an apparently normal attention span and concentration ability.   Speech is normal.  Cranial nerves: Extraocular movements are full.  Facial strength and sensation is normal.  Trapezius strength is normal.  The tongue is midline, and the patient has symmetric elevation of the soft palate. No obvious hearing deficits are noted.  Motor:  Muscle bulk is normal.   Tone is slightly increased in right leg. Strength is  5 / 5 in all 4 extremities.   Sensory: She has intact sensation to touch in the arms and lower legs but reduced  sensation in the anterolateral thigh on the right  Coordination: Cerebellar testing shows good finger-nose-finger.  Gait and station: Station is normal.   Her gait is mildly wide.  The tandem gait is moderately wide.. Romberg is negative.   Reflexes: Deep tendon reflexes are normal in the arms but mildly  increased in right leg.       DIAGNOSTIC DATA (LABS, IMAGING, TESTING) - I reviewed patient records, labs, notes, testing and imaging myself where available.     ASSESSMENT AND PLAN  Multiple  sclerosis (HCC) - Plan: CBC with Differential/Platelet  Other fatigue  Depression with anxiety  High risk medication use  Gait disturbance   1.   Continue Tecfidera.   Check CBC with Diff   2.   Renew Dextroamphetamine 10 mg po bid prn.  Continue duloxertine for mood, lamotrigine for dysesthesia and clonazepam at bedtime. 3.    She will use her cane outdoors and continue to do exercise 4.   She will return to see me in 6 months or sooner if she has new or worsening neurologic symptoms.   Richard A. Epimenio Foot, MD, PhD 07/15/2017, 11:37 AM Certified in Neurology, Clinical Neurophysiology, Sleep Medicine, Pain Medicine and Neuroimaging  [facility] [address] [place], [place] [address] [phone]

## 2017-07-16 ENCOUNTER — Telehealth: Payer: Self-pay | Admitting: *Deleted

## 2017-07-16 LAB — CBC WITH DIFFERENTIAL/PLATELET
BASOS: 0 %
Basophils Absolute: 0 10*3/uL (ref 0.0–0.2)
EOS (ABSOLUTE): 0.2 10*3/uL (ref 0.0–0.4)
EOS: 2 %
HEMATOCRIT: 43.4 % (ref 34.0–46.6)
HEMOGLOBIN: 13.5 g/dL (ref 11.1–15.9)
IMMATURE GRANULOCYTES: 0 %
Immature Grans (Abs): 0 10*3/uL (ref 0.0–0.1)
Lymphocytes Absolute: 2.6 10*3/uL (ref 0.7–3.1)
Lymphs: 38 %
MCH: 28.8 pg (ref 26.6–33.0)
MCHC: 31.1 g/dL — ABNORMAL LOW (ref 31.5–35.7)
MCV: 93 fL (ref 79–97)
Monocytes Absolute: 0.4 10*3/uL (ref 0.1–0.9)
Monocytes: 7 %
NEUTROS ABS: 3.6 10*3/uL (ref 1.4–7.0)
Neutrophils: 53 %
PLATELETS: 199 10*3/uL (ref 150–450)
RBC: 4.69 x10E6/uL (ref 3.77–5.28)
RDW: 13.9 % (ref 12.3–15.4)
WBC: 6.8 10*3/uL (ref 3.4–10.8)

## 2017-07-16 NOTE — Telephone Encounter (Signed)
Spoke with Jill Rogers this morning.  She sts. she is participating in an Psychologist, clinical study involving depression with MS patients.  Sts. she has signed a HIPPA release allowing our office to release information to the study, and so they may call with request.  I explained that as long as they provide [place]  with a copy of the signed HIPPA, we will be able to release information to them.  She verbalized understanding of same/fim

## 2017-07-16 NOTE — Telephone Encounter (Signed)
Spoke with [name] and reviewed below lab results. She verbalized understanding of same/fim 

## 2017-07-16 NOTE — Telephone Encounter (Signed)
-----   Message from Asa Lente, MD sent at 07/16/2017  8:21 AM EDT ----- Please let the patient know that the lab work is fine.

## 2017-07-18 ENCOUNTER — Telehealth: Payer: Self-pay | Admitting: Neurology

## 2017-07-18 MED ORDER — PREDNISONE 5 MG PO TABS: ORAL_TABLET | ORAL | 0 refills | Status: DC

## 2017-07-18 NOTE — Telephone Encounter (Signed)
I called the patient.  The patient has been on Tecfidera since February, she also started Cymbalta around that time, no other new medications.  She began having welts that developed first on the hip but then going to the elbow and hand, no diffuse rash.  This has been going on for a week.  The areas are quite itchy, Benadryl and topical ointment is not helping.  I am not sure that the tech Warner Mccreedy is the etiology of this, she will go ahead and stop the Tecfidera for now, I will call in a prednisone Dosepak.  She may go on the future to retry the secondary to see if the episodes recur when she restarts the drug.

## 2017-07-21 NOTE — Telephone Encounter (Signed)
Spoke with Jill Rogers. She sts. she is much  better after starting steroids.  Sts. itching is completely resolved and swelling is much better.  Sts. she now does not think welts were related to Tecfidera.  She will call back if she has further problems/fim

## 2017-07-21 NOTE — Telephone Encounter (Signed)
Please call Jill Rogers to see if she is better after the steroids.  I am not sure if this is coming from the type of there or not.  But if she is not any better we would need to consider a change in therapy and I could see her to discuss.  If she is better she should continue on the Tecfidera.

## 2018-01-04 ENCOUNTER — Other Ambulatory Visit: Payer: Self-pay | Admitting: Neurology

## 2018-01-04 MED ORDER — CLONAZEPAM 0.5 MG PO TABS: 0.5000 mg | ORAL_TABLET | Freq: Two times a day (BID) | ORAL | 5 refills | Status: DC | PRN

## 2018-01-04 NOTE — Telephone Encounter (Signed)
Called [facility] in [place], [place]. Pt last refilled clonazepam 0.5mg  on 11/27/16. She did not fill 07/15/17 #60, 5 refills that was sent it. Pt requested that be cancelled.   I called pt. She has been taking clonazepam. PCP prescribed clonazepam last. She takes prn. I did advise her that she can only get rx filled by one physician. She states PCP not filling anymore for her. Advised her to request future refills from Dr. Epimenio Foot.  She no longer uses [facility], uses [facility] on file. Advised I will send her request to Dr. Epimenio Foot. She has appt with him on 01/18/18. Last seen 07/15/17.

## 2018-01-04 NOTE — Telephone Encounter (Signed)
Pt requesting a refill for clonazePAM (KLONOPIN) 0.5 MG tablet sent to [facility]

## 2018-01-18 ENCOUNTER — Ambulatory Visit (INDEPENDENT_AMBULATORY_CARE_PROVIDER_SITE_OTHER): Payer: Medicare HMO | Admitting: Neurology

## 2018-01-18 ENCOUNTER — Encounter: Payer: Self-pay | Admitting: Neurology

## 2018-01-18 VITALS — BP 124/76 | HR 79 | Ht 63.0 in | Wt 194.5 lb

## 2018-01-18 DIAGNOSIS — G4700 Insomnia, unspecified: Secondary | ICD-10-CM

## 2018-01-18 DIAGNOSIS — R5383 Other fatigue: Secondary | ICD-10-CM

## 2018-01-18 DIAGNOSIS — F418 Other specified anxiety disorders: Secondary | ICD-10-CM | POA: Diagnosis not present

## 2018-01-18 DIAGNOSIS — G35 Multiple sclerosis: Secondary | ICD-10-CM

## 2018-01-18 DIAGNOSIS — R269 Unspecified abnormalities of gait and mobility: Secondary | ICD-10-CM

## 2018-01-18 DIAGNOSIS — L299 Pruritus, unspecified: Secondary | ICD-10-CM

## 2018-01-18 DIAGNOSIS — R208 Other disturbances of skin sensation: Secondary | ICD-10-CM

## 2018-01-18 DIAGNOSIS — G35D Multiple sclerosis, unspecified: Secondary | ICD-10-CM

## 2018-01-18 DIAGNOSIS — Z79899 Other long term (current) drug therapy: Secondary | ICD-10-CM

## 2018-01-18 MED ORDER — DEXTROAMPHETAMINE SULFATE 10 MG PO TABS: ORAL_TABLET | ORAL | 0 refills | Status: DC

## 2018-01-18 MED ORDER — LAMOTRIGINE 100 MG PO TABS: 100.0000 mg | ORAL_TABLET | Freq: Two times a day (BID) | ORAL | 3 refills | Status: DC

## 2018-01-18 MED ORDER — HYDROXYZINE HCL 25 MG PO TABS: 25.0000 mg | ORAL_TABLET | Freq: Three times a day (TID) | ORAL | 2 refills | Status: DC | PRN

## 2018-01-18 MED ORDER — DULOXETINE HCL 60 MG PO CPEP: 60.0000 mg | ORAL_CAPSULE | Freq: Every day | ORAL | 3 refills | Status: DC

## 2018-01-18 MED ORDER — AMANTADINE HCL 100 MG PO TABS: 100.0000 mg | ORAL_TABLET | Freq: Two times a day (BID) | ORAL | 3 refills | Status: DC

## 2018-01-18 NOTE — Progress Notes (Signed)
[facility]  PATIENT: Jill Rogers DOB: 02-18-1955  REFERRING DOCTOR OR PCP:  None SOURCE: patient  _________________________________   HISTORICAL  CHIEF COMPLAINT:  Chief Complaint  Patient presents with  . Follow-up    6 month follow up. Alone. Rm 12. No new concerns at this time.     HISTORY OF PRESENT ILLNESS:  Jill Rogers is a 62 y.o. female with multiple sclerosis.     Update 01/18/2018 She is on Tecfidera and has no exacerbation.    She has occasional flushing but no GI symptoms.   Some type of African tea helps when she has itching or flushing. Once a month she has a more severe flushing sensation with severe itching.  She feels much better than she did on Rebif.     Gait is the same.   She has a new cane "[employer]" that works better than her old quad cane.    She is focusing more while walking.     No falls but some stumbles, She has had some severe falls with concussion in the past.    Right side is weaker than her left.  Some mornings she has severe stiffness in the arms.  She notes occasional diplopia when tired that is fleeting.   Bladder function is good.   She notes improved fatigue off the rebif.   Sleep is still poor.  She has trouble relaxing enough to sleep at night and falls asleep most nights around 12-1 am and wakes up around 7-8 am.   One or two nights a week she feels up all night.  Mood is better on Cymbalta with anxiety >> depression.   She avoids crowds.   Cognitive issues are the same.  She only rarely takes dextroamphetamine.   She takes clonazepam if she needs to relax  Update 07/15/2017: She is feeling better in general since switching from Rebif to to Tecfidera.   She feels the best she has helt in 6 years.   She has nit had any exacerbations.   She is walking a little better but still uses the cane to prevent fall.   No falls since starting Tecfidera.  She tries to avoid stairs and cannot climb a ladder.    Strength is about the same  and numbness is rare.     Bladder function is fine.   Vision is fine.  Mood is better since changing meds and changing an SSRI to Cymbalta.  She gets out of the house more and is trying to do some traveling.   Fatigue is much better since the last visit and switching off Rebif.    Sleep is good most nights and she no longer needs naps.  Cognition is about the same, especially word finding difficulty.   She has trouble staying on task.    She does not use the dextroamphetamine daily  Update 01/14/2017:    She feels her MS is worse.   She does not have any new exacerbations but her symptoms seem worse.    She notes her tremor is more intense now.   Her gait is slowed and she relies on her cane more.   She notes more fatigue.   She tries to do some exercise daily, especially walking. She did have one fall when she turned on a step and fell, hitting her head (no LOC and she had CT in ED)  She feels more forgetful than earlier this year.  She is on Rebif but  misses almost 1/3 the injections.  She is tired of shots and we dicussed pills.    Her brain MRI 03/2016 was unchanged compared to 2016 nd the MRI cervical spine showed DJD but no spinal cord plaques.     She notes more depression and is tearful about times, feeling stressed due to her medical issues.   She sometimes has insomnia but also has trouble getting out of bed some days.   She is on Paxil 40 mg and lamotrigine.      Her mammogram was abnormal.   Initial breast biopsies were benign.   She is going to need another breast biopsy tomorrow.   She has a colonoscopy next week.   She had a hysterectomy for endometrial cancer.    From 07/15/2016: She was recently diagnosed with COPD and is on a daily inhaler.  She also had an abnormal Echo and is being referred to cardiology.  MS:   She notes more issues with falls recently.  No definite exacerbation. .    She is on Rebif and is tolerating it well.   MRIs were stable.    I reviewed the recent MRI's Of the  brain and spinal cord from 03/27/2016. Images were personally reviewed.  The MRI of the cervical spine shows a normal spinal cord. There are degenerative changes at C4-C5 encroaching upon both of the exiting C5 nerve root, at C5-C6 encroaching upon the left C6 nerve root and at C6-C7 causing mild spinal stenosis. The MRI of the brain shows multiple T2/FLAIR hyperintense foci consistent with MS. There have been no changes since 05/06/2014 MRI.  Gait/Strength/sensation:  Gait is poor due to reduce balance.   She has recent fall injuring her left leg. This occurred while she was outside doing gardening.  Usually the left leg is better than her right leg.    She needs a cane to walk safely.    She tires out easily with walking.   Legs feel weaker than her arms.     She has some spasticity.   She denies arm weakness or spasticity.      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      Lamotrigine is helping dysesthesia.   Lyrica was stopped due to weight gain.  .    Vision:   She is noting diplopia at times, noting it more when watching TV and when tired. .   It lasts just 5-30 seconds when it occurs.    She sometimes has diplopia while driving or looking at TV (sitting or laying down) or when tired   She also needs reading glasses       Bladder:   SInce her hysterectomy, she notes more urinary urgency.      She has urinary frequency and urgency and if she can't get to bathroom in time, she has rare incontinence.   Years ago, she had a sling operation which greatly helped the urgency and incontinence.     Fatigue/Sleep:   She reports fatigue that is physical > cognitive.    She also has sleepiness.   Dextroamphetamine helped the sleepiness than the fatigue.    Heat and fever worsen her fatigue.     She feels sleepiness is better after a nap.  She sometimes has trouble with sleep onset since the jerks started.   In the past, she had a PSG. She does not have obstructive sleep  apnea.   Mood/Cognition:  She feels mood is stable.   She has mild depression but nothing significant.      She is on Paxil 40 mg but still notes depression.    She takes clonazepam when out of the house around other people.    Other:   She had multiple abscesses and was seeing dermatology.  These are better  MS History:  She was diagnosed in 1983 after she presented with right hand weakness and a limp.   She had an MRI consistent with MS and she did not need to have a LP.    Those symptoms improved after a few weeks.     She was initially placed on Symmetrel for her fatigue and received some steroids (oral or IV) several times.    She was started on Avonex in 1994.   She switched to Rebif 5 years ago after an exacerbation treated with IV solu-medrol.    Also during that time, her husband left and she was under a lot of stress.    She started seeing me around 2013.  Her last exacerbation was 2014 with some gait issues.   She tolerates Rebif well but has had some bruising at times.         REVIEW OF SYSTEMS: Constitutional: No fevers, chills, sweats, or change in appetite.   Fatigue.   She reports excessive sleepines Eyes: No visual changes.   Notes  double vision.  No eye pain Ear, nose and throat: No hearing loss, ear pain, nasal congestion, sore throat.  Recent ear infection Cardiovascular: No chest pain, palpitations Respiratory: No shortness of breath at rest or with exertion.   No wheezes GastrointestinaI: No nausea, vomiting, diarrhea, abdominal pain, fecal incontinence Genitourinary:see above Musculoskeletal: Mild neck pain, back pain.   Muscles stiff Integumentary: No rash, pruritus.   Has boils/abscesses Neurological: as above Psychiatric: see above Endocrine: No palpitations, diaphoresis, change in appetite, change in weigh or increased thirst Hematologic/Lymphatic: No anemia, petechiae.   Bruises easily at injeciton sites Allergic/Immunologic: No itchy/runny eyes, nasal  congestion, recent allergic reactions, rashes  ALLERGIES: Allergies  Allergen Reactions  . Hydrocodone Nausea And Vomiting  . Oxycodone Nausea And Vomiting  . Tape     bandaids rip her skin/fim    HOME MEDICATIONS:  Current Outpatient Medications:  .  acidophilus (RISAQUAD) CAPS capsule, Take 1 capsule by mouth daily., Disp: , Rfl:  .  Amantadine HCl 100 MG tablet, Take 1 tablet (100 mg total) by mouth 2 (two) times daily., Disp: 180 tablet, Rfl: 3 .  atorvastatin (LIPITOR) 10 MG tablet, Take 10 mg by mouth daily., Disp: , Rfl: 0 .  Biotin 5000 MCG CAPS, Take 5,000 mcg by mouth 2 (two) times daily at 8 am and 10 pm., Disp: , Rfl:  .  BREO ELLIPTA 100-25 MCG/INH AEPB, Inhale 1 puff into the lungs daily., Disp: , Rfl: 0 .  clonazePAM (KLONOPIN) 0.5 MG tablet, Take 1 tablet (0.5 mg total) by mouth 2 (two) times daily as needed for anxiety., Disp: 60 tablet, Rfl: 5 .  dextroamphetamine (DEXTROSTAT) 10 MG tablet, Take up to 3 tablets daily as needed., Disp: 90 tablet, Rfl: 0 .  Dimethyl Fumarate 240 MG CPDR, Take 240 mg by mouth 2 (two) times daily. , Disp: , Rfl:  .  DULoxetine (CYMBALTA) 60 MG capsule, Take 1 capsule (60 mg total) by mouth daily., Disp: 90 capsule, Rfl: 3 .  hydrochlorothiazide (HYDRODIURIL) 25 MG tablet, Take 25 mg by mouth daily., Disp: , Rfl:  .  indomethacin (INDOCIN) 25 MG capsule, One po tid with food as needed, Disp: 90 capsule, Rfl: 1 .  Iron-Vitamins (GERITOL COMPLETE) TABS, Take by mouth daily., Disp: , Rfl:  .  KRILL OIL/ASTAXANTHIN 1000 MG CAPS, Take 500 mg by mouth daily., Disp: , Rfl:  .  lamoTRIgine (LAMICTAL) 100 MG tablet, Take 1 tablet (100 mg total) by mouth 2 (two) times daily., Disp: 180 tablet, Rfl: 3 .  meloxicam (MOBIC) 7.5 MG tablet, Take 7.5 mg by mouth as needed., Disp: , Rfl:  .  PROAIR HFA 108 (90 Base) MCG/ACT inhaler, , Disp: , Rfl:  .  Specialty Vitamins Products (MAGNESIUM, AMINO ACID CHELATE,) 133 MG tablet, Take 1 tablet by mouth 2 (two)  times daily., Disp: , Rfl:  .  vitamin C (ASCORBIC ACID) 500 MG tablet, Take 500 mg by mouth daily., Disp: , Rfl:  .  Vitamin D, Cholecalciferol, 1000 UNITS CAPS, Take 2,000 Units by mouth daily., Disp: , Rfl:  .  co-enzyme Q-10 30 MG capsule, Take 30 mg by mouth 3 (three) times daily., Disp: , Rfl:  .  hydrOXYzine (ATARAX/VISTARIL) 25 MG tablet, Take 1 tablet (25 mg total) by mouth 3 (three) times daily as needed., Disp: 30 tablet, Rfl: 2 .  metoprolol succinate (TOPROL XL) 25 MG 24 hr tablet, Take 1 tablet (25 mg total) by mouth daily. (Patient not taking: Reported on 01/18/2018), Disp: 30 tablet, Rfl: 5 .  predniSONE (DELTASONE) 5 MG tablet, Begin taking 6 tablets daily, taper by one tablet daily until off the medication. (Patient not taking: Reported on 01/18/2018), Disp: 21 tablet, Rfl: 0 .  UNABLE TO FIND, Ultimate Flora Probiotic 25 billion once daily/fim, Disp: , Rfl:   PAST MEDICAL HISTORY: Past Medical History:  Diagnosis Date  . Movement disorder   . Multiple sclerosis (HCC)   . Vision abnormalities     PAST SURGICAL HISTORY: Past Surgical History:  Procedure Laterality Date  . CERVICAL CONE BIOPSY    . CHOLECYSTECTOMY, LAPAROSCOPIC      FAMILY HISTORY: Family History  Problem Relation Age of Onset  . Diabetes type II Mother   . Arthritis Mother   . Heart failure Father   . Diabetes type II Father     SOCIAL HISTORY:  Social History   Socioeconomic History  . Marital status: Divorced    Spouse name: Not on file  . Number of children: Not on file  . Years of education: Not on file  . Highest education level: Not on file  Occupational History  . Not on file  Social Needs  . Financial resource strain: Not on file  . Food insecurity:    Worry: Not on file    Inability: Not on file  . Transportation needs:    Medical: Not on file    Non-medical: Not on file  Tobacco Use  . Smoking status: Former Smoker    Packs/day: 0.50    Types: Cigarettes  . Smokeless  tobacco: Never Used  Substance and Sexual Activity  . Alcohol use: Yes    Alcohol/week: 0.0 standard drinks    Comment: occasional/fim  . Drug use: No  . Sexual activity: Not on file  Lifestyle  . Physical activity:    Days per week: Not on file    Minutes per session: Not on file  . Stress: Not on file  Relationships  . Social connections:    Talks on phone: Not on file    Gets together: Not on file  Attends religious service: Not on file    Active member of club or organization: Not on file    Attends meetings of clubs or organizations: Not on file    Relationship status: Not on file  . Intimate partner violence:    Fear of current or ex partner: Not on file    Emotionally abused: Not on file    Physically abused: Not on file    Forced sexual activity: Not on file  Other Topics Concern  . Not on file  Social History Narrative  . Not on file     PHYSICAL EXAM  Vitals:   01/18/18 1040  BP: 124/76  Pulse: 79  Weight: 194 lb 8 oz (88.2 kg)  Height: 5\' 3"  (1.6 m)    Body mass index is 34.45 kg/m.   General: The patient is well-developed and well-nourished and in no acute distress   Neurologic Exam  Mental status: The patient is alert and oriented x 3 at the time of the examination. The patient has apparent normal recent and remote memory, with an apparently normal attention span and concentration ability.   Speech is normal.  Cranial nerves: Extraocular movements are full.  Facial strength and sensation was normal.  Trapezius strength was normal.  The tongue is midline, and the patient has symmetric elevation of the soft palate. No obvious hearing deficits are noted.  Motor:  Muscle bulk is normal.   She has mildly increased tone in the right leg.. Strength is  5 / 5 in all 4 extremities.   Sensory: She has intact sensation to touch in the arms and lower legs reduced sensation in the right, worse over the anterolateral thigh.  Coordination: Cerebellar testing  shows good finger-nose-finger.  Gait and station: Station is normal.   Her gait is mildly wide.  The tandem gait is moderately wide.. Romberg is negative.   Reflexes: Deep tendon reflexes are normal in the arms but mildly  increased in right leg.       DIAGNOSTIC DATA (LABS, IMAGING, TESTING) - I reviewed patient records, labs, notes, testing and imaging myself where available.     ASSESSMENT AND PLAN  Multiple sclerosis (HCC) - Plan: CBC with Differential/Platelet, Hepatic function panel  Other fatigue  Depression with anxiety  Dysesthesia  High risk medication use - Plan: CBC with Differential/Platelet, Hepatic function panel  Gait disturbance  Pruritus  Insomnia, unspecified type   1.   Continue Tecfidera.  We will check CBC and CMP. 2.   She will continue duloxetine for mood, lamotrigine for dysesthesias/mood and clonazepam as needed.  We discussed that clonazepam could be taken as needed for her insomnia.  Renew Dextroamphetamine 10 mg po bid prn for cognitive fatigue. 3.    She will use her cane outdoors and continue to do exercise 4.   Hydroxyzine as needed itching  5.   she will return to see me in 6 months or sooner if she has new or worsening neurologic symptoms.   Yamina Lenis A. Epimenio Foot, MD, PhD 01/18/2018, 11:39 AM Certified in Neurology, Clinical Neurophysiology, Sleep Medicine, Pain Medicine and Neuroimaging  [facility] [address] [place], [place] [address] [phone]

## 2018-01-19 LAB — CBC WITH DIFFERENTIAL/PLATELET
BASOS ABS: 0.1 10*3/uL (ref 0.0–0.2)
Basos: 1 %
EOS (ABSOLUTE): 0.2 10*3/uL (ref 0.0–0.4)
EOS: 2 %
HEMATOCRIT: 43.5 % (ref 34.0–46.6)
HEMOGLOBIN: 14 g/dL (ref 11.1–15.9)
Immature Grans (Abs): 0 10*3/uL (ref 0.0–0.1)
Immature Granulocytes: 0 %
LYMPHS: 38 %
Lymphocytes Absolute: 2.3 10*3/uL (ref 0.7–3.1)
MCH: 28.9 pg (ref 26.6–33.0)
MCHC: 32.2 g/dL (ref 31.5–35.7)
MCV: 90 fL (ref 79–97)
MONOCYTES: 10 %
Monocytes Absolute: 0.6 10*3/uL (ref 0.1–0.9)
NEUTROS ABS: 3 10*3/uL (ref 1.4–7.0)
Neutrophils: 49 %
Platelets: 207 10*3/uL (ref 150–450)
RBC: 4.84 x10E6/uL (ref 3.77–5.28)
RDW: 13.1 % (ref 12.3–15.4)
WBC: 6.2 10*3/uL (ref 3.4–10.8)

## 2018-01-19 LAB — HEPATIC FUNCTION PANEL
ALK PHOS: 69 IU/L (ref 39–117)
ALT: 26 IU/L (ref 0–32)
AST: 26 IU/L (ref 0–40)
Albumin: 4.6 g/dL (ref 3.6–4.8)
BILIRUBIN, DIRECT: 0.12 mg/dL (ref 0.00–0.40)
Bilirubin Total: 0.3 mg/dL (ref 0.0–1.2)
TOTAL PROTEIN: 7 g/dL (ref 6.0–8.5)

## 2018-01-20 ENCOUNTER — Telehealth: Payer: Self-pay | Admitting: *Deleted

## 2018-01-20 NOTE — Telephone Encounter (Signed)
-----   Message from Asa Lente, MD sent at 01/19/2018  6:23 PM EST ----- Please let the patient know that the lab work is fine.

## 2018-01-20 NOTE — Telephone Encounter (Signed)
LMOM both home and cell #'s with below lab results. She doesn't need to return this call unless she has questions/fim

## 2018-03-22 ENCOUNTER — Telehealth: Payer: Self-pay | Admitting: Neurology

## 2018-03-22 MED ORDER — DIMETHYL FUMARATE 240 MG PO CPDR: 240.0000 mg | DELAYED_RELEASE_CAPSULE | Freq: Two times a day (BID) | ORAL | 11 refills | Status: DC

## 2018-03-22 NOTE — Telephone Encounter (Addendum)
I called pt back. [facility]vised [facility] since [facility]. She states [facility] all the information to fill medicare first and then assistance fund second.  Insurance: [employer] Part D. Prescription phone #: [phone] [facility] medicare). I called this number and they use [employer] specialty pharmacy (phone number: [phone]) as primary or [facility].   Last year she was getting rx via [employer] with [facility].(free drug)  Assistance fund phone #: [phone].  Specialty pharmacy they ZDG:UYQIH script (mail order- specialty) Phone: [phone] Fax: [phone]  Advised I will try and figure out specialty pharmacy and call her back w/ number for her to f/u with them after we send prescription to provide financial assistance fund info. She verbalized understanding  I called pt back and relayed she would use [facility]. I provided her phone number: [phone] to call and provide assistance fund information.

## 2018-03-22 NOTE — Telephone Encounter (Signed)
Pt needs refill for Dimethyl Fumarate 240 MG CPDR [facility]

## 2018-03-29 ENCOUNTER — Telehealth: Payer: Self-pay | Admitting: *Deleted

## 2018-03-29 NOTE — Telephone Encounter (Signed)
Submitted PA Tecfidera on covermymeds. Key: [id number]. Waiting on determination.   Pt has tried/failed: Avonex- had MS exacerbation and Rebif.  Dx: G35.

## 2018-03-29 NOTE — Telephone Encounter (Signed)
PA Case: [id number]. approved effective 02/08/2018-02/10/2019. Questions? Contact [phone].  Faxed notice of approval to [facility] specialty pharmacy at [phone]. Received fax confirmation.

## 2018-04-28 ENCOUNTER — Other Ambulatory Visit: Payer: Self-pay | Admitting: Neurology

## 2018-05-27 IMAGING — MG STEREOTACTIC VACUUM ASSIST RIGHT
5 series · 6 of 13 positions shown · non-contrast
Comparison: Previous exams.

ADDENDUM:
Pathology revealed FIBROCYSTIC CHANGES WITH APOCRINE METAPLASIA of
the Right breast, upper outer quadrant. This was found to be
concordant by Dr. Ingunn Harpa Ronlor. Pathology results were discussed
with the patient by telephone. The patient reported doing well after
the biopsy with tenderness at the site. Post biopsy instructions and
care were reviewed and questions were answered. The patient was
encouraged to call The [REDACTED] for any
additional concerns. The patient was instructed to return for annual
screening mammography at [REDACTED] in [HOSPITAL][HOSPITAL].

Pathology results reported by Fronczak, RN on 01/16/2017.
CLINICAL DATA: Status post outside ultrasound-guided biopsy of a
right breast mass at the 8:30 o'clock axis (pathology results
unknown). A subsequent post clip mammogram showed that the biopsied
mass did not correspond to the original mammographically detected
mass necessitating additional stereotactic biopsy today with 3D
tomosynthesis guidance.
EXAM:
RIGHT BREAST STEREOTACTIC CORE NEEDLE BIOPSY

[R CC (1 of 3)]
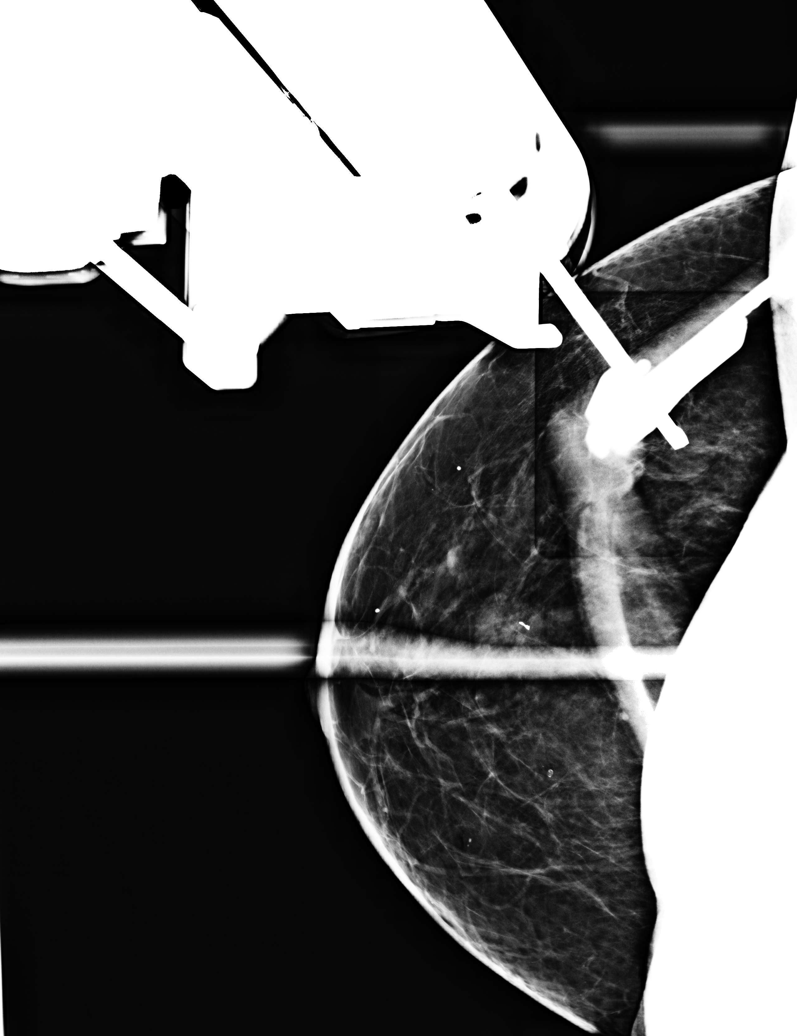

[R CC (2 of 3)]
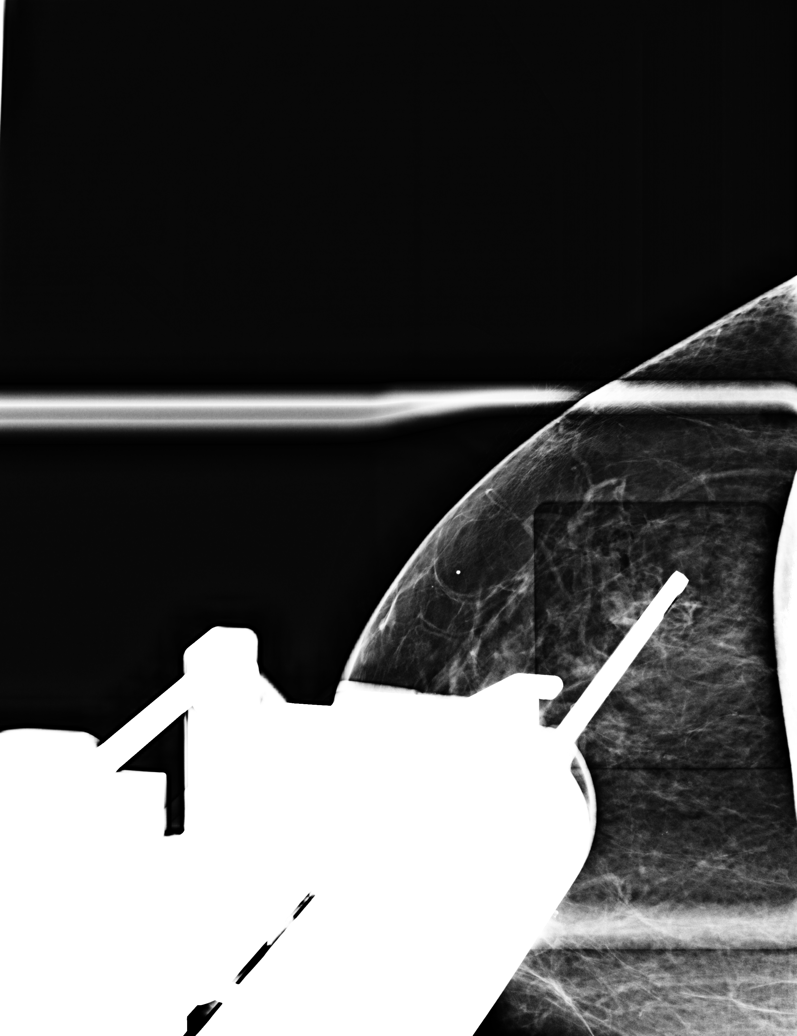

[R CC (3 of 3)]
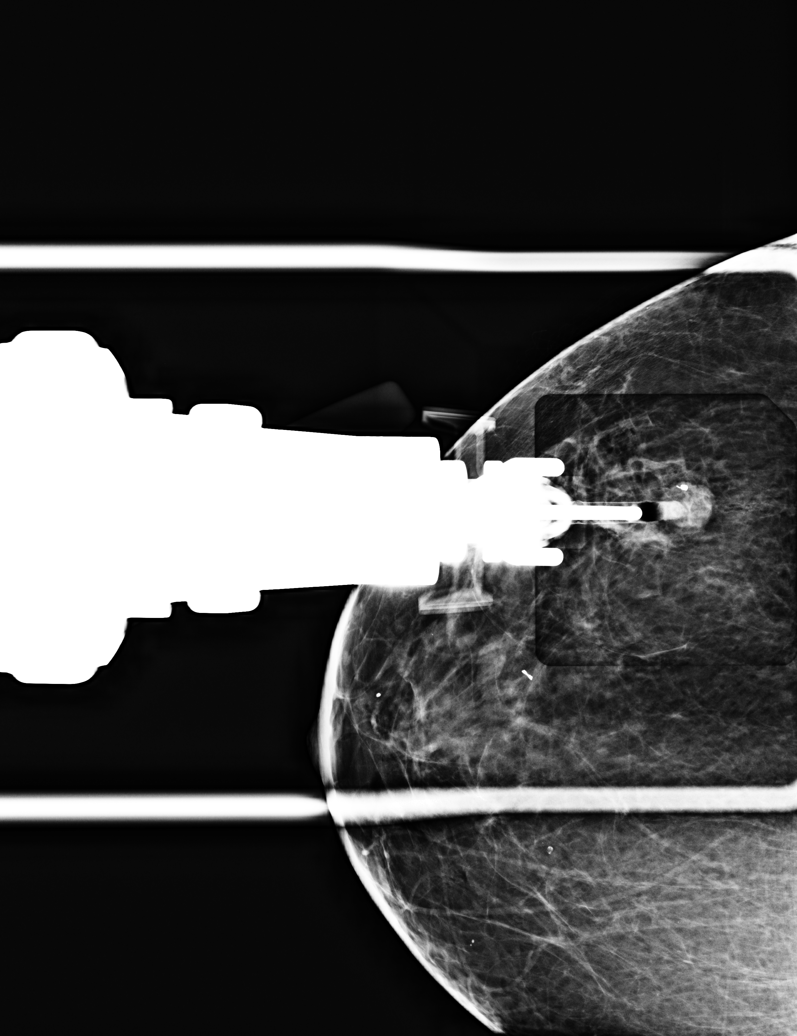

[R CC tomo · 2 of 61 frames shown (1 of 2)]
[frame 20/61]
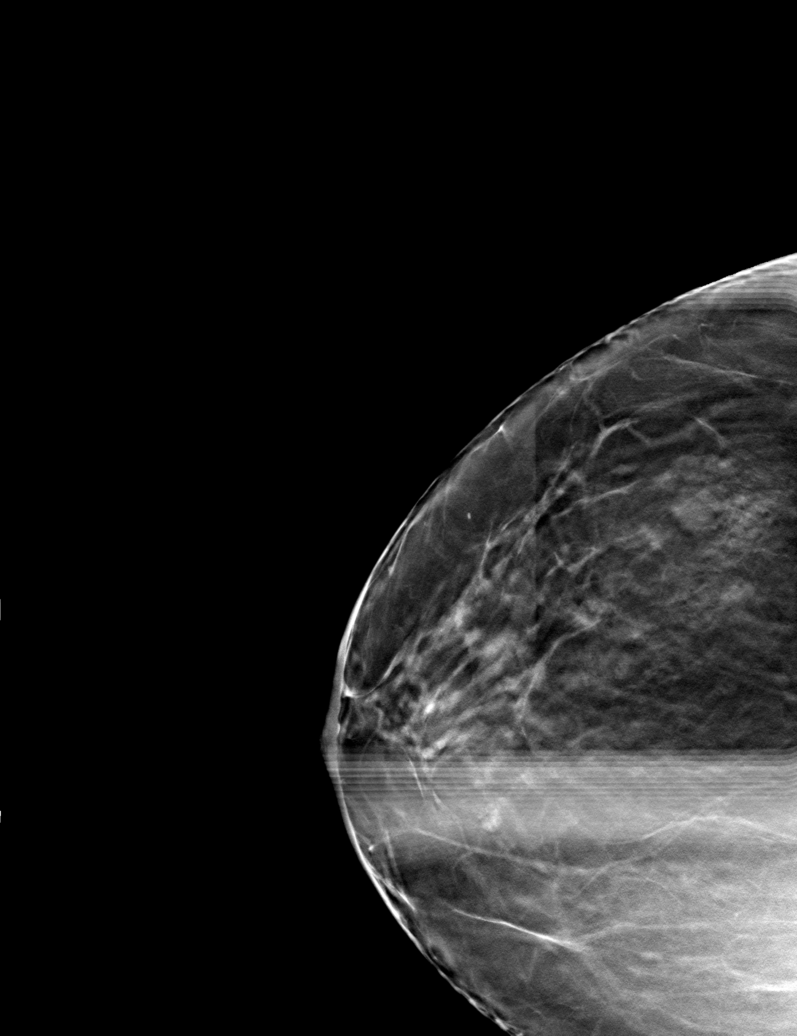
[frame 31/61]
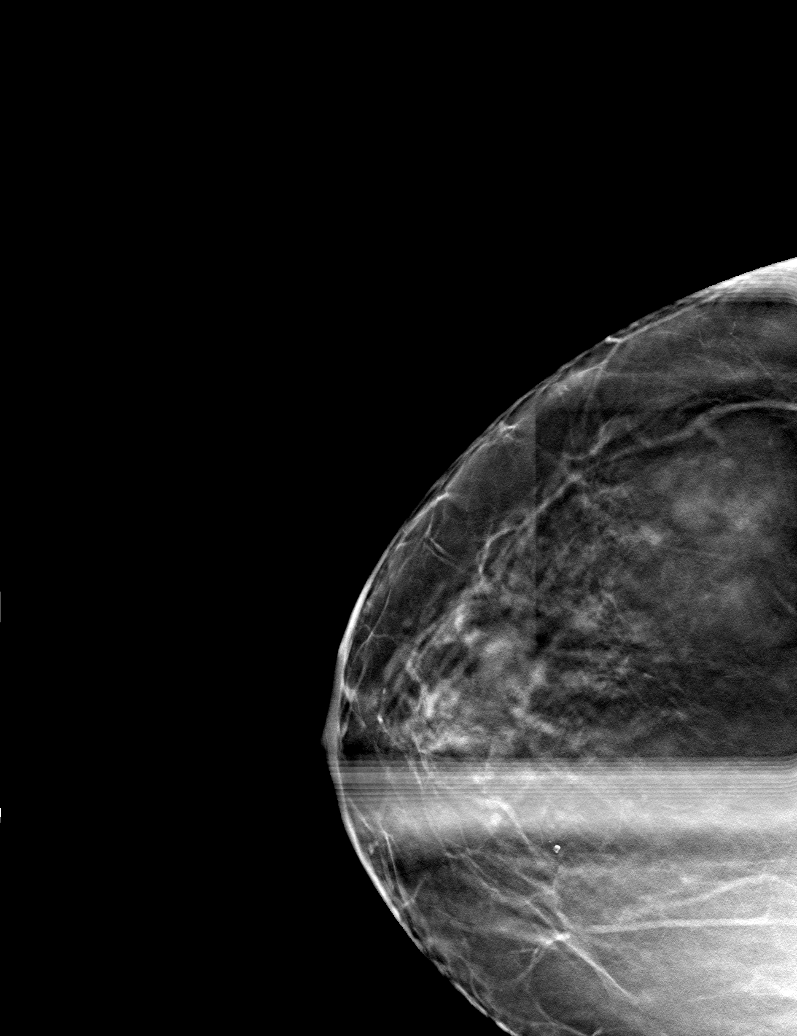

[R CC tomo (2 of 2) · tomo slice 31/61.0]
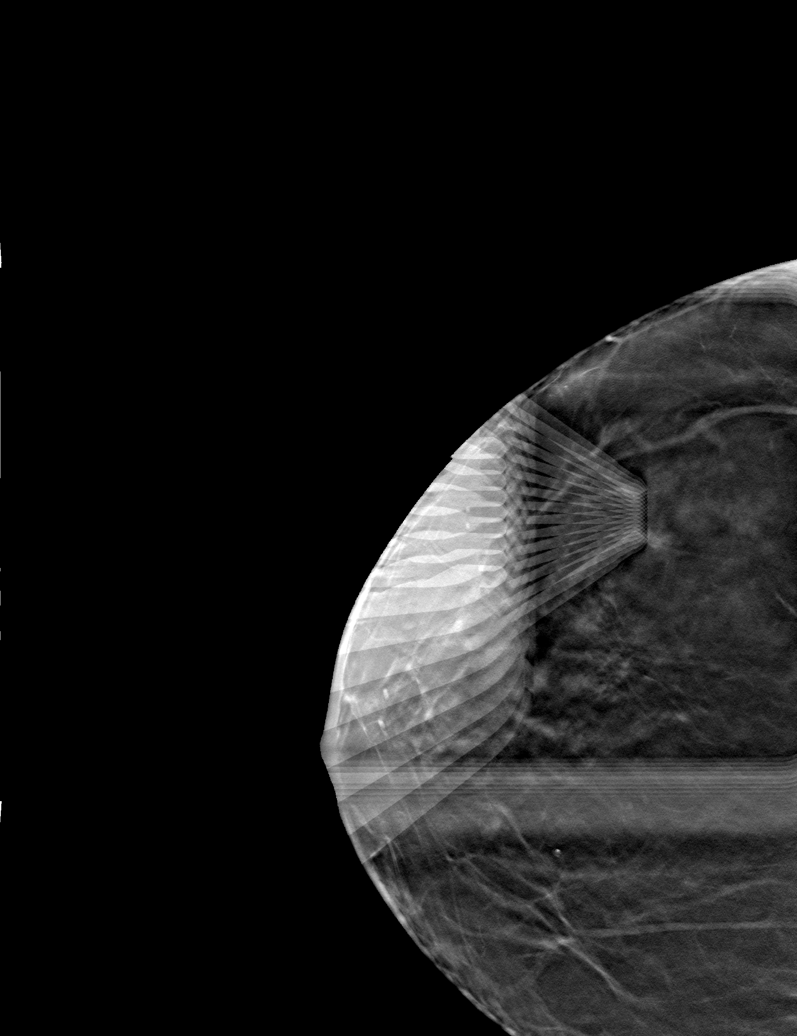

[6 of 13 positions shown; findings below may reference images not displayed]



Using sterile technique and 1% Lidocaine as local anesthetic, under
stereotactic guidance, a 9 gauge vacuum assisted device was used to
perform core needle biopsy of a mass within the upper-outer quadrant
of the right breast, 9:30 o'clock axis region, using a superior
approach.

Lesion quadrant: Upper outer quadrant

At the conclusion of the procedure, a coil shaped tissue marker clip
was deployed into the biopsy cavity. Follow-up 2-view mammogram was
performed and dictated separately.
IMPRESSION: Stereotactic-guided biopsy of the mass within the outer right
breast. No apparent complications.

## 2018-07-15 ENCOUNTER — Telehealth: Payer: Self-pay | Admitting: *Deleted

## 2018-07-15 NOTE — Telephone Encounter (Signed)
Called, LVM for pt to call about upcoming appt on 07/20/18 at 11am with Dr. Epimenio Foot. Explained d/t concerns over coronavirus we are offering virtual visits. Wanted to know if she would be okay with doing this.

## 2018-07-19 ENCOUNTER — Telehealth: Payer: Self-pay | Admitting: Neurology

## 2018-07-19 NOTE — Telephone Encounter (Signed)
Noted  

## 2018-07-19 NOTE — Telephone Encounter (Signed)
Pt has consented to a Virtual Visit via smart phone. [phone] [employer]. Link has been sent.  Pt understands that although there may be some limitations with this type of visit, we will take all precautions to reduce any security or privacy concerns.  Pt understands that this will be treated like an in office visit and we will file with pt's insurance, and there may be a patient responsible charge related to this service.

## 2018-07-20 ENCOUNTER — Ambulatory Visit: Payer: Medicare HMO | Admitting: Neurology

## 2018-07-20 NOTE — Telephone Encounter (Signed)
Called, LVM for pt to call office back so I can update med list, pharmacy, allergies on file before VV on 6.11.20

## 2018-07-21 NOTE — Telephone Encounter (Signed)
Called and spoke w/ pt. Updated med list, pharmacy, allergies.

## 2018-07-21 NOTE — Addendum Note (Signed)
Addended by: Rossie Muskrat L on: 07/21/2018 11:41 AM   Modules accepted: Orders

## 2018-07-22 ENCOUNTER — Other Ambulatory Visit: Payer: Self-pay

## 2018-07-22 ENCOUNTER — Ambulatory Visit (INDEPENDENT_AMBULATORY_CARE_PROVIDER_SITE_OTHER): Payer: Medicare HMO | Admitting: Neurology

## 2018-07-22 ENCOUNTER — Encounter: Payer: Self-pay | Admitting: Neurology

## 2018-07-22 DIAGNOSIS — Z79899 Other long term (current) drug therapy: Secondary | ICD-10-CM

## 2018-07-22 DIAGNOSIS — G4700 Insomnia, unspecified: Secondary | ICD-10-CM

## 2018-07-22 DIAGNOSIS — G35 Multiple sclerosis: Secondary | ICD-10-CM

## 2018-07-22 DIAGNOSIS — R269 Unspecified abnormalities of gait and mobility: Secondary | ICD-10-CM

## 2018-07-22 DIAGNOSIS — F418 Other specified anxiety disorders: Secondary | ICD-10-CM | POA: Diagnosis not present

## 2018-07-22 DIAGNOSIS — R208 Other disturbances of skin sensation: Secondary | ICD-10-CM | POA: Diagnosis not present

## 2018-07-22 MED ORDER — CLONAZEPAM 0.5 MG PO TABS: 0.5000 mg | ORAL_TABLET | Freq: Two times a day (BID) | ORAL | 5 refills | Status: DC | PRN

## 2018-07-22 NOTE — Progress Notes (Signed)
[facility]  PATIENT: Jill Rogers DOB: Apr 12, 1955  REFERRING DOCTOR OR PCP:  None SOURCE: patient  _________________________________   HISTORICAL  CHIEF COMPLAINT:  Chief Complaint  Patient presents with  . Multiple Sclerosis  . Gait Problem    HISTORY OF PRESENT ILLNESS:  Jill Rogers is a 63 y.o. female with multiple sclerosis.     Update 07/22/2018: Virtual Visit via Telephone Note I connected with Jill Rogers on 07/22/18 at  3:00 PM EDT by telephone and verified that I am speaking with the correct person using two identifiers. Location:     Patient: home        Provider: office  I discussed the limitations, risks, security and privacy concerns of performing an evaluation and management service by telephone and the availability of in person appointments. I also discussed with the patient that there may be a patient responsible charge related to this service. The patient expressed understanding and agreed to proceed.  History of Present Illness: She is on Tecfidera since February 2019.   Lymphocytes were 2.3.   She denies any exacerbations but gait is a little more unsteady and she has several falls.  Her left sided sciatica flared up and she was placed on gabapentin,.   She is walking with crutches/poles (bilateral) and feels she is more secure when she uses them.   She can walk 1/2 mile.  Her right leg is weaker than her left.    Lightning [facility] her [facility] and she was without AC for several weeks.  She was more tired and felt mor cognitive fog with the extra heat.   She needed to put cold towels around her neck in front of a fan.   Since [facility] is back on again, she is better.      She has a recent UTI and was treated with an antibiotic.    Vision is fine.   She has fatigue and some insomnia.   Mood is better since her [facility] is up but she was irritable.    She has some anxiety.    Cymbalta has helped.      Observations/Objective: She is alert and oriented  with fluent speech and good attention, knowledge and memory  Assessment and Plan: 1. Multiple sclerosis (HCC)   2. Insomnia, unspecified type   3. Depression with anxiety   4. Dysesthesia   5. High risk medication use   6. Gait disturbance     1.  Continue Tecfidera.  We will check CBC and diff at the next visit.   Consider MRI later in year to determine if there is any subclinical progression.  If present, consider a change in therapy. 2.  She will use the walking sticks and we discussed a walker if symptoms worsen 3.   Continue medications.  I will renew the clonazepam insomnia and anxiety. 4.   She will return to see us in 6 months or sooner if there are new or worsening neurologic symptoms.  She will need to have a CBC with differential at the next visit and be set up for MRI  Follow Up Instructions:  I discussed the assessment and treatment plan with the patient. The patient was provided an opportunity to ask questions and all were answered. The patient agreed with the plan and demonstrated an understanding of the instructions.   The patient was advised to call back or seek an in-person evaluation if the symptoms worsen or if the condition fails to improve as  anticipated.  I provided 22 minutes of non-face-to-face time during this encounter.  _________________________________________  Update 01/18/2018 She is on Tecfidera and has no exacerbation.    She has occasional flushing but no GI symptoms.   Some type of African tea helps when she has itching or flushing. Once a month she has a more severe flushing sensation with severe itching.  She feels much better than she did on Rebif.     Gait is the same.   She has a new cane "[employer] Cane" that works better than her old quad cane.    She is focusing more while walking.     No falls but some stumbles, She has had some severe falls with concussion in the past.    Right side is weaker than her left.  Some mornings she has severe stiffness in  the arms.  She notes occasional diplopia when tired that is fleeting.   Bladder function is good.   She notes improved fatigue off the rebif.   Sleep is still poor.  She has trouble relaxing enough to sleep at night and falls asleep most nights around 12-1 am and wakes up around 7-8 am.   One or two nights a week she feels up all night.  Mood is better on Cymbalta with anxiety >> depression.   She avoids crowds.   Cognitive issues are the same.  She only rarely takes dextroamphetamine.   She takes clonazepam if she needs to relax  Update 07/15/2017: She is feeling better in general since switching from Rebif to to Tecfidera.   She feels the best she has helt in 6 years.   She has nit had any exacerbations.   She is walking a little better but still uses the cane to prevent fall.   No falls since starting Tecfidera.  She tries to avoid stairs and cannot climb a ladder.    Strength is about the same and numbness is rare.     Bladder function is fine.   Vision is fine.  Mood is better since changing meds and changing an SSRI to Cymbalta.  She gets out of the house more and is trying to do some traveling.   Fatigue is much better since the last visit and switching off Rebif.    Sleep is good most nights and she no longer needs naps.  Cognition is about the same, especially word finding difficulty.   She has trouble staying on task.    She does not use the dextroamphetamine daily  Update 01/14/2017:    She feels her MS is worse.   She does not have any new exacerbations but her symptoms seem worse.    She notes her tremor is more intense now.   Her gait is slowed and she relies on her cane more.   She notes more fatigue.   She tries to do some exercise daily, especially walking. She did have one fall when she turned on a step and fell, hitting her head (no LOC and she had CT in ED)  She feels more forgetful than earlier this year.  She is on Rebif but misses almost 1/3 the injections.  She is tired of shots and we  dicussed pills.    Her brain MRI 03/2016 was unchanged compared to 2016 nd the MRI cervical spine showed DJD but no spinal cord plaques.     She notes more depression and is tearful about times, feeling stressed due to her medical issues.  She sometimes has insomnia but also has trouble getting out of bed some days.   She is on Paxil 40 mg and lamotrigine.      Her mammogram was abnormal.   Initial breast biopsies were benign.   She is going to need another breast biopsy tomorrow.   She has a colonoscopy next week.   She had a hysterectomy for endometrial cancer.    From 07/15/2016: She was recently diagnosed with COPD and is on a daily inhaler.  She also had an abnormal Echo and is being referred to cardiology.  MS:   She notes more issues with falls recently.  No definite exacerbation. .    She is on Rebif and is tolerating it well.   MRIs were stable.    I reviewed the recent MRI's Of the brain and spinal cord from 03/27/2016. Images were personally reviewed.  The MRI of the cervical spine shows a normal spinal cord. There are degenerative changes at C4-C5 encroaching upon both of the exiting C5 nerve root, at C5-C6 encroaching upon the left C6 nerve root and at C6-C7 causing mild spinal stenosis. The MRI of the brain shows multiple T2/FLAIR hyperintense foci consistent with MS. There have been no changes since 05/06/2014 MRI.  Gait/Strength/sensation:  Gait is poor due to reduce balance.   She has recent fall injuring her left leg. This occurred while she was outside doing gardening.  Usually the left leg is better than her right leg.    She needs a cane to walk safely.    She tires out easily with walking.   Legs feel weaker than her arms.     She has some spasticity.   She denies arm weakness or spasticity.      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      Lamotrigine is helping dysesthesia.   Lyrica was stopped due to weight gain.  .    Vision:   She is  noting diplopia at times, noting it more when watching TV and when tired. .   It lasts just 5-30 seconds when it occurs.    She sometimes has diplopia while driving or looking at TV (sitting or laying down) or when tired   She also needs reading glasses       Bladder:   SInce her hysterectomy, she notes more urinary urgency.      She has urinary frequency and urgency and if she can't get to bathroom in time, she has rare incontinence.   Years ago, she had a sling operation which greatly helped the urgency and incontinence.     Fatigue/Sleep:   She reports fatigue that is physical > cognitive.    She also has sleepiness.   Dextroamphetamine helped the sleepiness than the fatigue.    Heat and fever worsen her fatigue.     She feels sleepiness is better after a nap.  She sometimes has trouble with sleep onset since the jerks started.   In the past, she had a PSG. She does not have obstructive sleep apnea.   Mood/Cognition:    She feels mood is stable.   She has mild depression but nothing significant.      She is on Paxil 40 mg but still notes depression.    She takes clonazepam when out of the house around other people.    Other:   She had multiple abscesses and was seeing dermatology.  These  are better  MS History:  She was diagnosed in 1983 after she presented with right hand weakness and a limp.   She had an MRI consistent with MS and she did not need to have a LP.    Those symptoms improved after a few weeks.     She was initially placed on Symmetrel for her fatigue and received some steroids (oral or IV) several times.    She was started on Avonex in 1994.   She switched to Rebif 5 years ago after an exacerbation treated with IV solu-medrol.    Also during that time, her husband left and she was under a lot of stress.    She started seeing me around 2013.  Her last exacerbation was 2014 with some gait issues.   She tolerates Rebif well but has had some bruising at times.         REVIEW OF  SYSTEMS: Constitutional: No fevers, chills, sweats, or change in appetite.   Fatigue.   She reports excessive sleepines Eyes: No visual changes.   Notes  double vision.  No eye pain Ear, nose and throat: No hearing loss, ear pain, nasal congestion, sore throat.  Recent ear infection Cardiovascular: No chest pain, palpitations Respiratory: No shortness of breath at rest or with exertion.   No wheezes GastrointestinaI: No nausea, vomiting, diarrhea, abdominal pain, fecal incontinence Genitourinary:see above Musculoskeletal: Mild neck pain, back pain.   Muscles stiff Integumentary: No rash, pruritus.   Has boils/abscesses Neurological: as above Psychiatric: see above Endocrine: No palpitations, diaphoresis, change in appetite, change in weigh or increased thirst Hematologic/Lymphatic: No anemia, petechiae.   Bruises easily at injeciton sites Allergic/Immunologic: No itchy/runny eyes, nasal congestion, recent allergic reactions, rashes  ALLERGIES: Allergies  Allergen Reactions  . Hydrocodone Nausea And Vomiting  . Oxycodone Nausea And Vomiting  . Tape     bandaids rip her skin/fim    HOME MEDICATIONS:  Current Outpatient Medications:  .  acidophilus (RISAQUAD) CAPS capsule, Take 1 capsule by mouth daily., Disp: , Rfl:  .  Amantadine HCl 100 MG tablet, Take 1 tablet (100 mg total) by mouth 2 (two) times daily., Disp: 180 tablet, Rfl: 3 .  atorvastatin (LIPITOR) 10 MG tablet, Take 10 mg by mouth daily., Disp: , Rfl: 0 .  Biotin 5000 MCG CAPS, Take 5,000 mcg by mouth daily. , Disp: , Rfl:  .  BREO ELLIPTA 100-25 MCG/INH AEPB, Inhale 1 puff into the lungs daily., Disp: , Rfl: 0 .  clonazePAM (KLONOPIN) 0.5 MG tablet, Take 1 tablet (0.5 mg total) by mouth 2 (two) times daily as needed for anxiety., Disp: 60 tablet, Rfl: 5 .  co-enzyme Q-10 30 MG capsule, Take 30 mg by mouth daily. , Disp: , Rfl:  .  dextroamphetamine (DEXTROSTAT) 10 MG tablet, Take up to 3 tablets daily as needed.,  Disp: 90 tablet, Rfl: 0 .  Dimethyl Fumarate 240 MG CPDR, Take 1 capsule (240 mg total) by mouth 2 (two) times daily., Disp: 60 capsule, Rfl: 11 .  DULoxetine (CYMBALTA) 60 MG capsule, Take 1 capsule (60 mg total) by mouth daily., Disp: 90 capsule, Rfl: 3 .  gabapentin (NEURONTIN) 300 MG capsule, Take 300 mg by mouth 3 (three) times daily., Disp: , Rfl:  .  hydrochlorothiazide (HYDRODIURIL) 25 MG tablet, Take 25 mg by mouth daily., Disp: , Rfl:  .  hydrOXYzine (ATARAX/VISTARIL) 25 MG tablet, Take 1 tablet (25 mg total) by mouth 3 (three) times daily as needed., Disp: 30 tablet,  Rfl: 2 .  indomethacin (INDOCIN) 25 MG capsule, One po tid with food as needed, Disp: 90 capsule, Rfl: 1 .  Iron-Vitamins (GERITOL COMPLETE) TABS, Take by mouth daily., Disp: , Rfl:  .  KRILL OIL/ASTAXANTHIN 1000 MG CAPS, Take 500 mg by mouth 2 (two) times a day. , Disp: , Rfl:  .  lamoTRIgine (LAMICTAL) 100 MG tablet, Take 1 tablet (100 mg total) by mouth 2 (two) times daily., Disp: 180 tablet, Rfl: 3 .  meloxicam (MOBIC) 7.5 MG tablet, Take 7.5 mg by mouth as needed., Disp: , Rfl:  .  PROAIR HFA 108 (90 Base) MCG/ACT inhaler, , Disp: , Rfl:  .  Specialty Vitamins Products (MAGNESIUM, AMINO ACID CHELATE,) 133 MG tablet, Take 1 tablet by mouth 2 (two) times daily., Disp: , Rfl:  .  UNABLE TO FIND, Ultimate Flora Probiotic 25 billion once daily/fim, Disp: , Rfl:  .  vitamin C (ASCORBIC ACID) 500 MG tablet, Take 500 mg by mouth daily., Disp: , Rfl:  .  Vitamin D, Cholecalciferol, 1000 UNITS CAPS, Take 2,000 Units by mouth daily., Disp: , Rfl:   PAST MEDICAL HISTORY: Past Medical History:  Diagnosis Date  . Movement disorder   . Multiple sclerosis ([place])   . Vision abnormalities     PAST SURGICAL HISTORY: Past Surgical History:  Procedure Laterality Date  . CERVICAL CONE BIOPSY    . CHOLECYSTECTOMY, LAPAROSCOPIC      FAMILY HISTORY: Family History  Problem Relation Age of Onset  . Diabetes type II Mother   .  Arthritis Mother   . Heart failure Father   . Diabetes type II Father     SOCIAL HISTORY:  Social History   Socioeconomic History  . Marital status: Divorced    Spouse name: Not on file  . Number of children: Not on file  . Years of education: Not on file  . Highest education level: Not on file  Occupational History  . Not on file  Social Needs  . Financial resource strain: Not on file  . Food insecurity    Worry: Not on file    Inability: Not on file  . Transportation needs    Medical: Not on file    Non-medical: Not on file  Tobacco Use  . Smoking status: Former Smoker    Packs/day: 0.50    Types: Cigarettes  . Smokeless tobacco: Never Used  Substance and Sexual Activity  . Alcohol use: Yes    Alcohol/week: 0.0 standard drinks    Comment: occasional/fim  . Drug use: No  . Sexual activity: Not on file  Lifestyle  . Physical activity    Days per week: Not on file    Minutes per session: Not on file  . Stress: Not on file  Relationships  . Social Herbalist on phone: Not on file    Gets together: Not on file    Attends religious service: Not on file    Active member of club or organization: Not on file    Attends meetings of clubs or organizations: Not on file    Relationship status: Not on file  . Intimate partner violence    Fear of current or ex partner: Not on file    Emotionally abused: Not on file    Physically abused: Not on file    Forced sexual activity: Not on file  Other Topics Concern  . Not on file  Social History Narrative  . Not on file  PHYSICAL EXAM  There were no vitals filed for this visit.  There is no height or weight on file to calculate BMI.   General: The patient is well-developed and well-nourished and in no acute distress   Neurologic Exam  Mental status: The patient is alert and oriented x 3 at the time of the examination. The patient has apparent normal recent and remote memory, with an apparently normal  attention span and concentration ability.   Speech is normal.  Cranial nerves: Extraocular movements are full.  Facial strength and sensation was normal.  Trapezius strength was normal.  The tongue is midline, and the patient has symmetric elevation of the soft palate. No obvious hearing deficits are noted.  Motor:  Muscle bulk is normal.   She has mildly increased tone in the right leg.. Strength is  5 / 5 in all 4 extremities.   Sensory: She has intact sensation to touch in the arms and lower legs reduced sensation in the right, worse over the anterolateral thigh.  Coordination: Cerebellar testing shows good finger-nose-finger.  Gait and station: Station is normal.   Her gait is mildly wide.  The tandem gait is moderately wide.. Romberg is negative.   Reflexes: Deep tendon reflexes are normal in the arms but mildly  increased in right leg.       Dovie Kapusta A. Epimenio FootSater, MD, PhD 07/22/2018, 3:39 PM Certified in Neurology, Clinical Neurophysiology, Sleep Medicine, Pain Medicine and Neuroimaging  [facility] [address], [address] [place], [place] [address] [phone]

## 2018-08-19 ENCOUNTER — Telehealth: Payer: Self-pay | Admitting: *Deleted

## 2018-08-19 NOTE — Telephone Encounter (Signed)
Received fax notification from [facility] that they received enrollment for pt for the therapy: Tecfidera.  [address] [place], [place] [address] Phone: [phone] Fax: [phone]

## 2018-09-27 ENCOUNTER — Telehealth: Payer: Self-pay | Admitting: Neurology

## 2018-09-27 NOTE — Telephone Encounter (Signed)
09/27/18 LVM to schedule f/u

## 2018-10-27 ENCOUNTER — Other Ambulatory Visit: Payer: Self-pay | Admitting: *Deleted

## 2018-10-27 MED ORDER — TECFIDERA 240 MG PO CPDR: 1.0000 | DELAYED_RELEASE_CAPSULE | Freq: Two times a day (BID) | ORAL | 3 refills | Status: DC

## 2019-01-26 ENCOUNTER — Ambulatory Visit: Payer: Medicare HMO | Admitting: Neurology

## 2019-01-26 ENCOUNTER — Encounter: Payer: Self-pay | Admitting: Neurology

## 2019-01-26 ENCOUNTER — Telehealth: Payer: Self-pay | Admitting: *Deleted

## 2019-01-26 ENCOUNTER — Other Ambulatory Visit: Payer: Self-pay

## 2019-01-26 VITALS — BP 142/84 | HR 108 | Ht 63.0 in | Wt 198.0 lb

## 2019-01-26 DIAGNOSIS — F339 Major depressive disorder, recurrent, unspecified: Secondary | ICD-10-CM

## 2019-01-26 DIAGNOSIS — G35 Multiple sclerosis: Secondary | ICD-10-CM

## 2019-01-26 DIAGNOSIS — R269 Unspecified abnormalities of gait and mobility: Secondary | ICD-10-CM | POA: Diagnosis not present

## 2019-01-26 DIAGNOSIS — G4719 Other hypersomnia: Secondary | ICD-10-CM

## 2019-01-26 DIAGNOSIS — R5383 Other fatigue: Secondary | ICD-10-CM | POA: Diagnosis not present

## 2019-01-26 DIAGNOSIS — R208 Other disturbances of skin sensation: Secondary | ICD-10-CM

## 2019-01-26 MED ORDER — MELOXICAM 7.5 MG PO TABS: ORAL_TABLET | ORAL | 3 refills | Status: DC

## 2019-01-26 MED ORDER — INDOMETHACIN 25 MG PO CAPS: ORAL_CAPSULE | ORAL | 0 refills | Status: AC

## 2019-01-26 MED ORDER — HYDROXYZINE HCL 25 MG PO TABS: 25.0000 mg | ORAL_TABLET | Freq: Three times a day (TID) | ORAL | 2 refills | Status: DC | PRN

## 2019-01-26 MED ORDER — DULOXETINE HCL 60 MG PO CPEP: 60.0000 mg | ORAL_CAPSULE | Freq: Every day | ORAL | 3 refills | Status: DC

## 2019-01-26 MED ORDER — LAMOTRIGINE 100 MG PO TABS: 100.0000 mg | ORAL_TABLET | Freq: Two times a day (BID) | ORAL | 3 refills | Status: DC

## 2019-01-26 MED ORDER — DEXTROAMPHETAMINE SULFATE 10 MG PO TABS: ORAL_TABLET | ORAL | 0 refills | Status: DC

## 2019-01-26 MED ORDER — AMANTADINE HCL 100 MG PO TABS: 100.0000 mg | ORAL_TABLET | Freq: Two times a day (BID) | ORAL | 3 refills | Status: DC

## 2019-01-26 MED ORDER — CLONAZEPAM 0.5 MG PO TABS: 0.5000 mg | ORAL_TABLET | Freq: Two times a day (BID) | ORAL | 5 refills | Status: DC | PRN

## 2019-01-26 NOTE — Progress Notes (Signed)
[facility]  PATIENT: Jill Rogers DOB: [birth year]  REFERRING DOCTOR OR PCP:  None SOURCE: patient  _________________________________   HISTORICAL  CHIEF COMPLAINT:  Chief Complaint  Patient presents with  . Follow-up    RM 12, alone. Tecfidera going well. Trouble with boils. Trouble with her knee-taking meloxicam. Stopped gabapentin.    HISTORY OF PRESENT ILLNESS:  Jill Rogers is a 63 y.o. female with multiple sclerosis.      Update 01/26/2019: For the most part her MS is stable but she notes more hand numbness and more fatigue in the afternoons.    She is on Tecfidera and tolerates it well.  She is concerned because the patient assistance plan has run out.  We discussed switching over to Vumerity as it has the same active ingredients Tecfidera and is well-tolerated.  She uses a cane to help with her gait outdoors but not for shorter distance.      She feels strength and gait are about the same as the last visit.    She has stable mild leg weakness.  She gets occasional jerks especially when drowsy.  She has numbness in the hands, left little more than right.  She has some urinary urgency.  Sleep is the  Mood is worse with more depression.  She notes some irritability..   Her sister has metastatic cancer.    Update 07/22/2018 (virtual) She is on Tecfidera since February 2019.   Lymphocytes were 2.3.   She denies any exacerbations but gait is a little more uns labile.  [place] and she has several falls.  Her left sided sciatica flared up and she was placed on gabapentin,.   She is walking with crutches/poles (bilateral) and feels she is more secure when she uses them.   She can walk 1/2 mile.  Her right leg is weaker than her left.    Lightning struck her [facility] and she was without AC for several weeks.  She was more tired and felt mor cognitive fog with the extra heat.   She needed to put cold towels around her neck in front of a fan.   Since her [facility] is back on  again, she is better.      She has a recent UTI and was treated with an antibiotic.    Vision is fine.   She has fatigue and some insomnia.   Mood is better since her [facility] is up but she was irritable.    She has some anxiety.    Cymbalta has helped.        _________________________________________  Update 01/18/2018 She is on Tecfidera and has no exacerbation.    She has occasional flushing but no GI symptoms.   Some type of African tea helps when she has itching or flushing. Once a month she has a more severe flushing sensation with severe itching.  She feels much better than she did on Rebif.     Gait is the same.   She has a new cane "[employer]" that works better than her old quad cane.    She is focusing more while walking.     No falls but some stumbles, She has had some severe falls with concussion in the past.    Right side is weaker than her left.  Some mornings she has severe stiffness in the arms.  She notes occasional diplopia when tired that is fleeting.   Bladder function is good.   She notes improved fatigue off the  rebif.   Sleep is still poor.  She has trouble relaxing enough to sleep at night and falls asleep most nights around 12-1 am and wakes up around 7-8 am.   One or two nights a week she feels up all night.  Mood is better on Cymbalta with anxiety >> depression.   She avoids crowds.   Cognitive issues are the same.  She only rarely takes dextroamphetamine.   She takes clonazepam if she needs to relax  Update 07/15/2017: She is feeling better in general since switching from Rebif to to Tecfidera.   She feels the best she has helt in 6 years.   She has nit had any exacerbations.   She is walking a little better but still uses the cane to prevent fall.   No falls since starting Tecfidera.  She tries to avoid stairs and cannot climb a ladder.    Strength is about the same and numbness is rare.     Bladder function is fine.   Vision is fine.  Mood is better since changing meds and  changing an SSRI to Cymbalta.  She gets out of the house more and is trying to do some traveling.   Fatigue is much better since the last visit and switching off Rebif.    Sleep is good most nights and she no longer needs naps.  Cognition is about the same, especially word finding difficulty.   She has trouble staying on task.    She does not use the dextroamphetamine daily  Update 01/14/2017:    She feels her MS is worse.   She does not have any new exacerbations but her symptoms seem worse.    She notes her tremor is more intense now.   Her gait is slowed and she relies on her cane more.   She notes more fatigue.   She tries to do some exercise daily, especially walking. She did have one fall when she turned on a step and fell, hitting her head (no LOC and she had CT in ED)  She feels more forgetful than earlier this year.  She is on Rebif but misses almost 1/3 the injections.  She is tired of shots and we dicussed pills.    Her brain MRI 03/2016 was unchanged compared to 2016 nd the MRI cervical spine showed DJD but no spinal cord plaques.     She notes more depression and is tearful about times, feeling stressed due to her medical issues.   She sometimes has insomnia but also has trouble getting out of bed some days.   She is on Paxil 40 mg and lamotrigine.      Her mammogram was abnormal.   Initial breast biopsies were benign.   She is going to need another breast biopsy tomorrow.   She has a colonoscopy next week.   She had a hysterectomy for endometrial cancer.    From 07/15/2016: She was recently diagnosed with COPD and is on a daily inhaler.  She also had an abnormal Echo and is being referred to cardiology.  MS:   She notes more issues with falls recently.  No definite exacerbation. .    She is on Rebif and is tolerating it well.   MRIs were stable.    I reviewed the recent MRI's Of the brain and spinal cord from 03/27/2016. Images were personally reviewed.  The MRI of the cervical spine shows a  normal spinal cord. There are degenerative changes at C4-C5 encroaching  upon both of the exiting C5 nerve root, at C5-C6 encroaching upon the left C6 nerve root and at C6-C7 causing mild spinal stenosis. The MRI of the brain shows multiple T2/FLAIR hyperintense foci consistent with MS. There have been no changes since 05/06/2014 MRI.  Gait/Strength/sensation:  Gait is poor due to reduce balance.   She has recent fall injuring her left leg. This occurred while she was outside doing gardening.  Usually the left leg is better than her right leg.    She needs a cane to walk safely.    She tires out easily with walking.   Legs feel weaker than her arms.     She has some spasticity.   She denies arm weakness or spasticity.      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      Lamotrigine is helping dysesthesia.   Lyrica was stopped due to weight gain.  .    Vision:   She is noting diplopia at times, noting it more when watching TV and when tired. .   It lasts just 5-30 seconds when it occurs.    She sometimes has diplopia while driving or looking at TV (sitting or laying down) or when tired   She also needs reading glasses       Bladder:   SInce her hysterectomy, she notes more urinary urgency.      She has urinary frequency and urgency and if she can't get to bathroom in time, she has rare incontinence.   Years ago, she had a sling operation which greatly helped the urgency and incontinence.     Fatigue/Sleep:   She reports fatigue that is physical > cognitive.    She also has sleepiness.   Dextroamphetamine helped the sleepiness than the fatigue.    Heat and fever worsen her fatigue.     She feels sleepiness is better after a nap.  She sometimes has trouble with sleep onset since the jerks started.   In the past, she had a PSG. She does not have obstructive sleep apnea.   Mood/Cognition:    She feels mood is stable.   She has mild depression but nothing significant.       She is on Paxil 40 mg but still notes depression.    She takes clonazepam when out of the house around other people.    Other:   She had multiple abscesses and was seeing dermatology.  These are better  MS History:  She was diagnosed in 1983 after she presented with right hand weakness and a limp.   She had an MRI consistent with MS and she did not need to have a LP.    Those symptoms improved after a few weeks.     She was initially placed on Symmetrel for her fatigue and received some steroids (oral or IV) several times.    She was started on Avonex in 1994.   She switched to Rebif 5 years ago after an exacerbation treated with IV solu-medrol.    Also during that time, her husband left and she was under a lot of stress.    She started seeing me around 2013.  Her last exacerbation was 2014 with some gait issues.   She tolerates Rebif well but has had some bruising at times.         REVIEW OF SYSTEMS: Constitutional: No fevers, chills, sweats, or change in appetite.   Fatigue.  She reports excessive sleepines Eyes: No visual changes.   Notes  double vision.  No eye pain Ear, nose and throat: No hearing loss, ear pain, nasal congestion, sore throat.  Recent ear infection Cardiovascular: No chest pain, palpitations Respiratory: No shortness of breath at rest or with exertion.   No wheezes GastrointestinaI: No nausea, vomiting, diarrhea, abdominal pain, fecal incontinence Genitourinary:see above Musculoskeletal: Mild neck pain, back pain.   Muscles stiff Integumentary: No rash, pruritus.   Has boils/abscesses Neurological: as above Psychiatric: see above Endocrine: No palpitations, diaphoresis, change in appetite, change in weigh or increased thirst Hematologic/Lymphatic: No anemia, petechiae.   Bruises easily at injeciton sites Allergic/Immunologic: No itchy/runny eyes, nasal congestion, recent allergic reactions, rashes  ALLERGIES: Allergies  Allergen Reactions  . Hydrocodone Nausea And  Vomiting  . Oxycodone Nausea And Vomiting  . Tape     bandaids rip her skin/fim    HOME MEDICATIONS:  Current Outpatient Medications:  .  acidophilus (RISAQUAD) CAPS capsule, Take 1 capsule by mouth daily., Disp: , Rfl:  .  Amantadine HCl 100 MG tablet, Take 1 tablet (100 mg total) by mouth 2 (two) times daily., Disp: 180 tablet, Rfl: 3 .  atorvastatin (LIPITOR) 10 MG tablet, Take 10 mg by mouth daily., Disp: , Rfl: 0 .  Biotin 5000 MCG CAPS, Take 5,000 mcg by mouth daily. , Disp: , Rfl:  .  BREO ELLIPTA 100-25 MCG/INH AEPB, Inhale 1 puff into the lungs daily., Disp: , Rfl: 0 .  clonazePAM (KLONOPIN) 0.5 MG tablet, Take 1 tablet (0.5 mg total) by mouth 2 (two) times daily as needed for anxiety., Disp: 60 tablet, Rfl: 5 .  co-enzyme Q-10 30 MG capsule, Take 30 mg by mouth daily. , Disp: , Rfl:  .  dextroamphetamine (DEXTROSTAT) 10 MG tablet, Take up to 3 tablets daily as needed., Disp: 90 tablet, Rfl: 0 .  Dimethyl Fumarate (TECFIDERA) 240 MG CPDR, Take 1 capsule (240 mg total) by mouth 2 (two) times daily., Disp: 90 capsule, Rfl: 3 .  doxycycline (VIBRAMYCIN) 100 MG capsule, Take 100 mg by mouth 2 (two) times daily., Disp: , Rfl:  .  DULoxetine (CYMBALTA) 60 MG capsule, Take 1 capsule (60 mg total) by mouth daily., Disp: 90 capsule, Rfl: 3 .  hydrochlorothiazide (HYDRODIURIL) 25 MG tablet, Take 25 mg by mouth daily., Disp: , Rfl:  .  hydrOXYzine (ATARAX/VISTARIL) 25 MG tablet, Take 1 tablet (25 mg total) by mouth 3 (three) times daily as needed., Disp: 30 tablet, Rfl: 2 .  indomethacin (INDOCIN) 25 MG capsule, One po tid with food as needed, Disp: 90 capsule, Rfl: 1 .  KRILL OIL/ASTAXANTHIN 1000 MG CAPS, Take 500 mg by mouth 2 (two) times a day. , Disp: , Rfl:  .  lamoTRIgine (LAMICTAL) 100 MG tablet, Take 1 tablet (100 mg total) by mouth 2 (two) times daily., Disp: 180 tablet, Rfl: 3 .  meloxicam (MOBIC) 7.5 MG tablet, Take by mouth., Disp: , Rfl:  .  PROAIR HFA 108 (90 Base) MCG/ACT  inhaler, , Disp: , Rfl:  .  Specialty Vitamins Products (MAGNESIUM, AMINO ACID CHELATE,) 133 MG tablet, Take 1 tablet by mouth 2 (two) times daily., Disp: , Rfl:  .  vitamin C (ASCORBIC ACID) 500 MG tablet, Take 500 mg by mouth daily., Disp: , Rfl:  .  Vitamin D, Cholecalciferol, 1000 UNITS CAPS, Take 2,000 Units by mouth daily., Disp: , Rfl:   PAST MEDICAL HISTORY: Past Medical History:  Diagnosis Date  . Movement disorder   .  Multiple sclerosis (HCC)   . Vision abnormalities     PAST SURGICAL HISTORY: Past Surgical History:  Procedure Laterality Date  . CERVICAL CONE BIOPSY    . CHOLECYSTECTOMY, LAPAROSCOPIC      FAMILY HISTORY: Family History  Problem Relation Age of Onset  . Diabetes type II Mother   . Arthritis Mother   . Heart failure Father   . Diabetes type II Father     SOCIAL HISTORY:  Social History   Socioeconomic History  . Marital status: Divorced    Spouse name: Not on file  . Number of children: Not on file  . Years of education: Not on file  . Highest education level: Not on file  Occupational History  . Not on file  Tobacco Use  . Smoking status: Former Smoker    Packs/day: 0.50    Types: Cigarettes  . Smokeless tobacco: Never Used  Substance and Sexual Activity  . Alcohol use: Yes    Alcohol/week: 0.0 standard drinks    Comment: occasional/fim  . Drug use: No  . Sexual activity: Not on file  Other Topics Concern  . Not on file  Social History Narrative  . Not on file   Social Determinants of Health   Financial Resource Strain:   . Difficulty of Paying Living Expenses: Not on file  Food Insecurity:   . Worried About Programme researcher, broadcasting/film/video in the Last Year: Not on file  . Ran Out of Food in the Last Year: Not on file  Transportation Needs:   . Lack of Transportation (Medical): Not on file  . Lack of Transportation (Non-Medical): Not on file  Physical Activity:   . Days of Exercise per Week: Not on file  . Minutes of Exercise per  Session: Not on file  Stress:   . Feeling of Stress : Not on file  Social Connections:   . Frequency of Communication with Friends and Family: Not on file  . Frequency of Social Gatherings with Friends and Family: Not on file  . Attends Religious Services: Not on file  . Active Member of Clubs or Organizations: Not on file  . Attends Banker Meetings: Not on file  . Marital Status: Not on file  Intimate Partner Violence:   . Fear of Current or Ex-Partner: Not on file  . Emotionally Abused: Not on file  . Physically Abused: Not on file  . Sexually Abused: Not on file     PHYSICAL EXAM  Vitals:   01/26/19 1136  BP: (!) 142/84  Pulse: (!) 108  Weight: 198 lb (89.8 kg)  Height:  (1.6 m)    Body mass index is 35.07 kg/m.   General: The patient is well-developed and well-nourished and in no acute distress   Neurologic Exam  Mental status: The patient is alert and oriented x 3 at the time of the examination. The patient has apparent normal recent and remote memory, with an apparently normal attention span and concentration ability.   Speech is normal.  Cranial nerves: Extraocular movements are full.  Facial strength and sensation was normal.  Trapezius strength was normal.  The tongue is midline, and the patient has symmetric elevation of the soft palate. No obvious hearing deficits are noted.  Motor:  Muscle bulk is normal.   She has mildly increased tone in the right leg.. Strength is  5 / 5 in all 4 extremities.   Sensory: She has intact sensation to touch in the arms  and lower legs she has reduced sensation in the right, this is worse over the anterolateral thigh..  Coordination: Cerebellar testing shows good finger-nose-finger.  Gait and station: Station is normal.   Gait is mildly wide and the tandem gait is moderately wide.  Romberg is negative..   Reflexes: Deep tendon reflexes are normal in the arms but mildly  increased in right leg.       Multiple sclerosis ([place]) - Plan: CBC with Differential/Platelets, CMP, MR BRAIN W WO CONTRAST  Other fatigue  Recurrent major depressive disorder, remission status unspecified (HCC)  Gait disturbance  Excessive daytime sleepiness  Dysesthesia   1.   She will switch from Tecfidera to [place].  This should be well tolerated.  We will check an MRI of the brain to determine if there is any subclinical activity.  If present, consider a switch to different disease modifying therapy. 2.   Refill other medications. 3.   Check CBC with differential and CMP. 4.   Stay active and exercise as tolerated 5.   return in 6 months or sooner if there are new or worseningneurologic symptoms.    Kortney Potvin A. Felecia Shelling, MD, PhD 26/33/3545, 62:56 AM Certified in Neurology, Clinical Neurophysiology, Sleep Medicine, Pain Medicine and Neuroimaging  [facility] [address], [place], [place] [address] [phone]

## 2019-01-26 NOTE — Telephone Encounter (Signed)
Faxed completed/signed Vumerity start form to [employer] at [phone]. Received fax confirmation.  

## 2019-01-26 NOTE — Telephone Encounter (Signed)
Received fax notification from [place] that they received Vumerity form and processing.

## 2019-01-27 ENCOUNTER — Telehealth: Payer: Self-pay | Admitting: *Deleted

## 2019-01-27 LAB — CBC WITH DIFFERENTIAL/PLATELET
Basophils Absolute: 0.1 10*3/uL (ref 0.0–0.2)
Basos: 1 %
EOS (ABSOLUTE): 0.1 10*3/uL (ref 0.0–0.4)
Eos: 1 %
Hematocrit: 44.8 % (ref 34.0–46.6)
Hemoglobin: 14.5 g/dL (ref 11.1–15.9)
Immature Grans (Abs): 0 10*3/uL (ref 0.0–0.1)
Immature Granulocytes: 0 %
Lymphocytes Absolute: 2.5 10*3/uL (ref 0.7–3.1)
Lymphs: 24 %
MCH: 29.6 pg (ref 26.6–33.0)
MCHC: 32.4 g/dL (ref 31.5–35.7)
MCV: 91 fL (ref 79–97)
Monocytes Absolute: 0.8 10*3/uL (ref 0.1–0.9)
Monocytes: 8 %
Neutrophils Absolute: 6.9 10*3/uL (ref 1.4–7.0)
Neutrophils: 66 %
Platelets: 213 10*3/uL (ref 150–450)
RBC: 4.9 x10E6/uL (ref 3.77–5.28)
RDW: 13.3 % (ref 11.7–15.4)
WBC: 10.4 10*3/uL (ref 3.4–10.8)

## 2019-01-27 LAB — COMPREHENSIVE METABOLIC PANEL
ALT: 14 IU/L (ref 0–32)
AST: 15 IU/L (ref 0–40)
Albumin/Globulin Ratio: 2.5 — ABNORMAL HIGH (ref 1.2–2.2)
Albumin: 4.8 g/dL (ref 3.8–4.8)
Alkaline Phosphatase: 80 IU/L (ref 39–117)
BUN/Creatinine Ratio: 23 (ref 12–28)
BUN: 16 mg/dL (ref 8–27)
Bilirubin Total: 0.2 mg/dL (ref 0.0–1.2)
CO2: 25 mmol/L (ref 20–29)
Calcium: 9.9 mg/dL (ref 8.7–10.3)
Chloride: 100 mmol/L (ref 96–106)
Creatinine, Ser: 0.71 mg/dL (ref 0.57–1.00)
GFR calc Af Amer: 106 mL/min/{1.73_m2} (ref >59)
GFR calc non Af Amer: 92 mL/min/{1.73_m2} (ref >59)
Globulin, Total: 1.9 g/dL (ref 1.5–4.5)
Glucose: 138 mg/dL — ABNORMAL HIGH (ref 65–99)
Potassium: 3.9 mmol/L (ref 3.5–5.2)
Sodium: 141 mmol/L (ref 134–144)
Total Protein: 6.7 g/dL (ref 6.0–8.5)

## 2019-01-27 MED ORDER — DIMETHYL FUMARATE 240 MG PO CPDR: DELAYED_RELEASE_CAPSULE | ORAL | 11 refills | Status: DC

## 2019-01-27 NOTE — Telephone Encounter (Signed)
PA denied. Med on non-formulary list. Per Dr. Felecia Shelling, she can either stay on Tecfidera and see if she can receive continued assistance or we can switch her to generic Tecfidera, dimethyl fumarate.   I called pt to discuss.

## 2019-01-27 NOTE — Telephone Encounter (Signed)
Initiated PA on CMM. Key:[id number]. In process of completing

## 2019-01-27 NOTE — Telephone Encounter (Signed)
Called and spoke with pt. Relayed Vumerity not covered. Relayed Dr Garth Bigness recommendations. She is agreeable to try generic dimethyl fumarate. She is no longer going to receive funding for Tecfidera brand name. She has about a 1.5 months or so left.  Advised I will send prescription to [facility] for generic and ask them to d/c brand name.  I called [employer] at [phone]. Spoke with April and advised Vumerity not covered by her insurance and she will not be placed on therapy at this time. PA done and denied. She verbalized understanding and updated pt chart.

## 2019-01-27 NOTE — Addendum Note (Signed)
Addended by: Hope Pigeon on: 01/27/2019 04:43 PM   Modules accepted: Orders

## 2019-01-27 NOTE — Telephone Encounter (Signed)
Submitted PA. Waiting on determination.   "Your information has been submitted to [place] [employer] Part D. [employer] will review the request and will issue a decision, typically within 1-3 days from your submission. If [employer] has not responded in 1-3 days or if you have any questions about your ePA request, please contact [place] [employer] Part D at [phone]."

## 2019-01-27 NOTE — Telephone Encounter (Signed)
Pt called in and stated the insurance dept reached out to her and stated they need PA and a statement of medical necessity.   [employer]:  Phone # [phone] ( insurance company if any questions needed)   Fax # [phone] ( fax completed form)

## 2019-01-27 NOTE — Telephone Encounter (Signed)
Spoke with patient and informed her that the lab work is fine. Patient verbalized understanding, appreciation.  

## 2019-02-01 ENCOUNTER — Telehealth: Payer: Self-pay | Admitting: *Deleted

## 2019-02-01 NOTE — Telephone Encounter (Signed)
Submitted PA indomethacin on CMM. Key: [id number]. Waiting on determination.  Your information has been submitted to [place] [employer]. [employer] will review the request and will issue a decision, typically within 1-3 days from your submission. If [employer] has not responded in 1-3 days or if you have any questions about your ePA request, please contact [place] [employer] at [phone]."

## 2019-02-01 NOTE — Telephone Encounter (Signed)
PA approved from 02/10/2018 - 02/10/2019 as non-formulary.  Member ID Number: [id number]

## 2019-02-07 ENCOUNTER — Telehealth: Payer: Self-pay | Admitting: *Deleted

## 2019-02-07 NOTE — Telephone Encounter (Signed)
Faxed completed/signed form back to [facility] [employer] asking for more info as to why dimethyl fumarate is requested over formulary: Tecfidera, Gilenya. Fax: [phone]. Received fax confirmation. Waiting on determination.

## 2019-02-08 NOTE — Addendum Note (Signed)
Addended by: Hope Pigeon on: 02/08/2019 11:39 AM   Modules accepted: Orders

## 2019-02-08 NOTE — Telephone Encounter (Signed)
Received fax from [facility] that dimethyl fumarate approved as non-formulary from 02/10/2018-02/10/2020.

## 2019-02-09 ENCOUNTER — Telehealth: Payer: Self-pay | Admitting: Neurology

## 2019-02-09 NOTE — Telephone Encounter (Signed)
Pt called wanting to know what the next step will be since the denial came through for the disease modifiers. Please advise.

## 2019-02-09 NOTE — Telephone Encounter (Signed)
I called pt about her denial letter she receive yesterday. Pt stated she receive a call from the insurance company that her generic Dimethyl Fumarate 240 MG was denied on 02/08/2019. I stated per Terrence Dupont RN note on 02/08/2019 it was approve 02/11/2019 to 02/10/2020. Pt stated it might of been cross in the paper work and not updated. [facility] to make sure they have the updated information.

## 2019-02-25 ENCOUNTER — Telehealth: Payer: Self-pay | Admitting: Neurology

## 2019-02-25 NOTE — Telephone Encounter (Addendum)
Pt has called to report that Wed. Morning she woke up the extreme itching all over redness all over face.  Pt states she drank tea and took various mediations to help ease the itching.  Pt states afterwards she was very tired and weak.  Pt would like to know if the idea of a MRI is still being considered for her because she has not heard from anyone.  Pt states the itching has gone away but she is still dealing with the exhaustion, pt wanted to report this and would like a call from RN on Monday.

## 2019-02-28 NOTE — Telephone Encounter (Signed)
Called pt back. She is feeling a lot better. No recent changes in detergent or soap. Has not started any new medications. She bought hemp cream and this helped ease her itching.   Advised per epic, she has MRI brain appt at [facility] 04/18/19 at 12:40pm. She checked her calendar and she had this written down, she forgot this was scheduled. I recommended she call to make sooner appt but she has decided to keep this one. Nothing further needed.

## 2019-03-16 ENCOUNTER — Telehealth: Payer: Self-pay | Admitting: *Deleted

## 2019-03-16 NOTE — Telephone Encounter (Signed)
Called and spoke with pt because I received fax from [employer] stating vumerity was shipped. However, last I spoke w/ pt, Vumerity not covered and we switched her to dimethyl fumarate (generic Tecfidera). She states she never filled the dimethyl fumarate. Copay was too expensive. She was getting ready to pull out money from 401K to be able to afford dimethyl fumarate when [employer] called and told her they could supply vumerity up to 1 year for free. No funding available right now from outside sources. She received vumerity today. She will let me know if she runs into any further issues moving forward.

## 2019-03-29 NOTE — Telephone Encounter (Signed)
Noted, we will work on [place]

## 2019-03-29 NOTE — Telephone Encounter (Signed)
Casimiro Needle @ [facility] has called stating that a PA is needed for Vumerity Cover My Meds Key#[id number] Casimiro Needle can be called at [phone]

## 2019-03-30 ENCOUNTER — Telehealth: Payer: Self-pay | Admitting: *Deleted

## 2019-03-30 NOTE — Telephone Encounter (Signed)
Submitted PA Vumerity on CMM. Key: [id number]. Waiting on determination.

## 2019-03-30 NOTE — Telephone Encounter (Signed)
Received fax from [place] [facility] that PA Vumerity denied. Must try/fail both Gilenya and Tecfidera. Pt receiving free drug from [facility] currently.

## 2019-03-30 NOTE — Telephone Encounter (Signed)
Error

## 2019-04-06 ENCOUNTER — Other Ambulatory Visit: Payer: Self-pay | Admitting: Neurology

## 2019-04-07 ENCOUNTER — Telehealth: Payer: Self-pay | Admitting: Neurology

## 2019-04-07 NOTE — Telephone Encounter (Signed)
[employer] medicare Jill Rogers: [id number] (exp. 04/06/19 to 10/03/19) patient is scheduled at [facility] for 04/18/19.

## 2019-04-18 ENCOUNTER — Other Ambulatory Visit: Payer: Self-pay

## 2019-04-18 ENCOUNTER — Ambulatory Visit
Admission: RE | Admit: 2019-04-18 | Discharge: 2019-04-18 | Disposition: A | Discharge status: Home / Self Care | Payer: Medicare HMO | Source: Ambulatory Visit | Attending: Neurology | Admitting: Neurology

## 2019-04-18 DIAGNOSIS — G35 Multiple sclerosis: Secondary | ICD-10-CM

## 2019-04-18 MED ORDER — GADOBENATE DIMEGLUMINE 529 MG/ML IV SOLN
15.0000 mL | Freq: Once | INTRAVENOUS | Status: AC | PRN
  Administered 2019-04-18: 15 mL via INTRAVENOUS

## 2019-04-19 ENCOUNTER — Telehealth: Payer: Self-pay | Admitting: *Deleted

## 2019-04-19 NOTE — Telephone Encounter (Signed)
Called pt. She only takes dextroamphetamine as needed on busy days. She is going to try taking more consistently: 10mg  po qam, then next dose a few hours later and last dose a few hours after that. She will call back if this is ineffective. Nothing further needed.

## 2019-04-19 NOTE — Telephone Encounter (Signed)
-----   Message from Asa Lente, MD sent at 04/18/2019  7:00 PM EST ----- Please let her know that the MRI of the brain did not show any new MS lesions.

## 2019-04-19 NOTE — Telephone Encounter (Signed)
Pt records mailed to [facility] on 04/19/19 

## 2019-04-19 NOTE — Telephone Encounter (Signed)
Please let her know that generally the stimulants have helped the most.  She is currently on Dexedrine 30 mg a day.  We could increase to 40 mg (10 mg x 4 that she could take 20 in the morning, 10 a few hours later and 10 a few hours later)

## 2019-04-19 NOTE — Telephone Encounter (Signed)
I called and spoke with patient about MRI results per Dr. Epimenio Foot note. She verbalized understanding.   Pt wants to know if Dr. Epimenio Foot knows of any advancements for treatment of cognitive issues with MS? She feels her cognitive issues have worsened. She is open to alternative meds/OTC/or prescription medications if needed.

## 2019-06-13 ENCOUNTER — Telehealth: Payer: Self-pay | Admitting: Neurology

## 2019-06-13 MED ORDER — METHYLPREDNISOLONE 4 MG PO TBPK: ORAL_TABLET | ORAL | 0 refills | Status: DC

## 2019-06-13 NOTE — Telephone Encounter (Signed)
Pt reports growing warts that are growing on her all over and they are itchy and painful and she  has to use cold compresses to get relief and believes the vumerity has caused the warts. Pt states no OTC creams are working for relief

## 2019-06-13 NOTE — Telephone Encounter (Signed)
I do not think this would be coming from the Vumerity.  She could see her primary care doctor or be referred to a dermatologist.

## 2019-06-13 NOTE — Telephone Encounter (Signed)
Called pt. Relayed Dr. Bonnita Hollow recommendation. She is agreeable to this plan. She is already taking OTC benadryl, she will continue this. I e-scribed rx methylprednisolone 4mg . Directions: take 6 tablets on day 1 and then decrease by 1 tablet each day until finished. #21, 0 refills.

## 2019-06-13 NOTE — Telephone Encounter (Signed)
Lets go ahead and send in a steroid pack and she can take OTC Benadryl.  If symptoms worsen or do not improve over the next few days she should stop the Vumerity.

## 2019-06-13 NOTE — Telephone Encounter (Signed)
Dr. Epimenio Foot- do you think Vumerity would be causing this? Or do you want her to f/u with PCP?

## 2019-07-12 NOTE — Telephone Encounter (Signed)
Pt has called to report that the welts have come back all over.  Pt is asking for a call as she was told to call back if this were to occur again.

## 2019-07-12 NOTE — Telephone Encounter (Signed)
She can stop Vumerity.   We need to get her in in next 2-3 weeks to go over otheroptions

## 2019-07-12 NOTE — Addendum Note (Signed)
Addended by: Arther Abbott on: 07/12/2019 05:18 PM   Modules accepted: Orders

## 2019-07-12 NOTE — Telephone Encounter (Addendum)
Called pt. She is already scheduled for an appt 07/27/19 at 1pm with Dr. Epimenio Foot. She will keep this. She will stop the vumerity. I took off med list and added to list of allergies.I emailed [employer] to let them know she is d/c'ing.

## 2019-07-12 NOTE — Telephone Encounter (Signed)
Dr. Epimenio Foot- previously you mentioned for her to stop Vumerity if this occurred again. Please advise

## 2019-07-14 DIAGNOSIS — M67431 Ganglion, right wrist: Secondary | ICD-10-CM | POA: Insufficient documentation

## 2019-07-27 ENCOUNTER — Encounter: Payer: Self-pay | Admitting: Neurology

## 2019-07-27 ENCOUNTER — Other Ambulatory Visit: Payer: Self-pay

## 2019-07-27 ENCOUNTER — Ambulatory Visit: Payer: Medicare HMO | Admitting: Neurology

## 2019-07-27 VITALS — BP 128/73 | HR 77 | Ht 63.0 in | Wt 195.0 lb

## 2019-07-27 DIAGNOSIS — R269 Unspecified abnormalities of gait and mobility: Secondary | ICD-10-CM | POA: Diagnosis not present

## 2019-07-27 DIAGNOSIS — F339 Major depressive disorder, recurrent, unspecified: Secondary | ICD-10-CM

## 2019-07-27 DIAGNOSIS — F418 Other specified anxiety disorders: Secondary | ICD-10-CM | POA: Diagnosis not present

## 2019-07-27 DIAGNOSIS — G35 Multiple sclerosis: Secondary | ICD-10-CM | POA: Diagnosis not present

## 2019-07-27 DIAGNOSIS — F988 Other specified behavioral and emotional disorders with onset usually occurring in childhood and adolescence: Secondary | ICD-10-CM | POA: Insufficient documentation

## 2019-07-27 MED ORDER — CLONAZEPAM 0.5 MG PO TABS: 0.5000 mg | ORAL_TABLET | Freq: Two times a day (BID) | ORAL | 5 refills | Status: DC | PRN

## 2019-07-27 MED ORDER — DEXTROAMPHETAMINE SULFATE 10 MG PO TABS: ORAL_TABLET | ORAL | 0 refills | Status: DC

## 2019-07-27 MED ORDER — DULOXETINE HCL 60 MG PO CPEP: 60.0000 mg | ORAL_CAPSULE | Freq: Every day | ORAL | 3 refills | Status: DC

## 2019-07-27 MED ORDER — HYDROXYZINE HCL 25 MG PO TABS: 25.0000 mg | ORAL_TABLET | Freq: Three times a day (TID) | ORAL | 2 refills | Status: DC | PRN

## 2019-07-27 MED ORDER — AMANTADINE HCL 100 MG PO TABS: 100.0000 mg | ORAL_TABLET | Freq: Two times a day (BID) | ORAL | 3 refills | Status: DC

## 2019-07-27 MED ORDER — LAMOTRIGINE 100 MG PO TABS: 100.0000 mg | ORAL_TABLET | Freq: Two times a day (BID) | ORAL | 3 refills | Status: DC

## 2019-07-27 NOTE — Progress Notes (Signed)
[facility]  PATIENT: Jill Rogers DOB: 10-10-1955  REFERRING DOCTOR OR PCP:  None SOURCE: patient  _________________________________   HISTORICAL  CHIEF COMPLAINT:  Chief Complaint  Patient presents with  . Follow-up    Rm 12- 6 month MS f/u reports she has not been doing well since her last visit. She reports her family has not been doing well and is under a lot of stress     HISTORY OF PRESENT ILLNESS:  Jill Rogers is a 64 y.o. female with relapsing remitting multiple sclerosis.     Update 07/27/2019: She is having a lot of family stress with her sister having stage 4 cancer and her mom recently breaking her arm.  She is needing to take care of both and they are in different cities.    Her MS was stable on Vumerity but sh had skin reaction with large patches of hives..   Her last exacerbation was probably 7 years ago.   She uses a cane to help with her gait outdoors but not for shorter distance.    She feels strength and gait are about the same as the last visit.    She has stable mild leg weakness.  She gets occasional jerks especially when drowsy.  She has numbness in the hands, left little more than right.  She has some urinary urgency.    She is noting more fatigue.   She has more depression.   She feels her cognition is doing a little worse due to all the added stress.   Summary From 07/15/2016:  MS:   She notes more issues with falls recently.  No definite exacerbation. .    She is on Rebif and is tolerating it well.   MRIs were stable.    I reviewed the recent MRI's Of the brain and spinal cord from 03/27/2016. Images were personally reviewed.  The MRI of the cervical spine shows a normal spinal cord. There are degenerative changes at C4-C5 encroaching upon both of the exiting C5 nerve root, at C5-C6 encroaching upon the left C6 nerve root and at C6-C7 causing mild spinal stenosis. The MRI of the brain shows multiple T2/FLAIR hyperintense foci consistent  with MS. There have been no changes since 05/06/2014 MRI.  Gait/Strength/sensation:  Gait is poor due to reduce balance.   She has recent fall injuring her left leg. This occurred while she was outside doing gardening.  Usually the left leg is better than her right leg.    She needs a cane to walk safely.    She tires out easily with walking.   Legs feel weaker than her arms.     She has some spasticity.   She denies arm weakness or spasticity.      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      Lamotrigine is helping dysesthesia.   Lyrica was stopped due to weight gain.  .    Vision:   She is noting diplopia at times, noting it more when watching TV and when tired. .   It lasts just 5-30 seconds when it occurs.    She sometimes has diplopia while driving or looking at TV (sitting or laying down) or when tired   She also needs reading glasses       Bladder:   SInce her hysterectomy, she notes more urinary urgency.      She has urinary frequency and urgency and if  she can't get to bathroom in time, she has rare incontinence.   Years ago, she had a sling operation which greatly helped the urgency and incontinence.     Fatigue/Sleep:   She reports fatigue that is physical > cognitive.    She also has sleepiness.   Dextroamphetamine helped the sleepiness than the fatigue.    Heat and fever worsen her fatigue.     She feels sleepiness is better after a nap.  She sometimes has trouble with sleep onset since the jerks started.   In the past, she had a PSG. She does not have obstructive sleep apnea.   Mood/Cognition:    She feels mood is stable.   She has mild depression but nothing significant.      She is on Paxil 40 mg but still notes depression.    She takes clonazepam when out of the house around other people.    Other:   She had multiple abscesses and was seeing dermatology.  These are better  MS History:  She was diagnosed in 1983 after she presented with right  hand weakness and a limp.   She had an MRI consistent with MS and she did not need to have a LP.    Those symptoms improved after a few weeks.     She was initially placed on Symmetrel for her fatigue and received some steroids (oral or IV) several times.    She was started on Avonex in 1994.   She switched to Rebif 5 years ago after an exacerbation treated with IV solu-medrol.    Also during that time, her husband left and she was under a lot of stress.    She started seeing me around 2013.  Her last exacerbation was 2014 with some gait issues.   She tolerates Rebif well but has had some bruising at times.         REVIEW OF SYSTEMS: Constitutional: No fevers, chills, sweats, or change in appetite.   Fatigue.   She reports excessive sleepines Eyes: No visual changes.   Notes  double vision.  No eye pain Ear, nose and throat: No hearing loss, ear pain, nasal congestion, sore throat.  Recent ear infection Cardiovascular: No chest pain, palpitations Respiratory: No shortness of breath at rest or with exertion.   No wheezes GastrointestinaI: No nausea, vomiting, diarrhea, abdominal pain, fecal incontinence Genitourinary:see above Musculoskeletal: Mild neck pain, back pain.   Muscles stiff Integumentary: No rash, pruritus.   Has boils/abscesses Neurological: as above Psychiatric: see above Endocrine: No palpitations, diaphoresis, change in appetite, change in weigh or increased thirst Hematologic/Lymphatic: No anemia, petechiae.   Bruises easily at injeciton sites Allergic/Immunologic: No itchy/runny eyes, nasal congestion, recent allergic reactions, rashes  ALLERGIES: Allergies  Allergen Reactions  . Hydrocodone Nausea And Vomiting  . Oxycodone Nausea And Vomiting  . Tape     bandaids rip her skin/fim  . Vumerity [Diroximel Fumarate]     welts    HOME MEDICATIONS:  Current Outpatient Medications:  .  acidophilus (RISAQUAD) CAPS capsule, Take 1 capsule by mouth daily., Disp: , Rfl:  .   Amantadine HCl 100 MG tablet, Take 1 tablet (100 mg total) by mouth 2 (two) times daily., Disp: 180 tablet, Rfl: 3 .  atorvastatin (LIPITOR) 10 MG tablet, Take 10 mg by mouth daily., Disp: , Rfl: 0 .  BREO ELLIPTA 100-25 MCG/INH AEPB, Inhale 1 puff into the lungs daily., Disp: , Rfl: 0 .  clonazePAM (KLONOPIN) 0.5 MG tablet,  Take 1 tablet (0.5 mg total) by mouth 2 (two) times daily as needed for anxiety., Disp: 60 tablet, Rfl: 5 .  co-enzyme Q-10 30 MG capsule, Take 30 mg by mouth daily. , Disp: , Rfl:  .  dextroamphetamine (DEXTROSTAT) 10 MG tablet, Take up to 3 tablets daily as needed., Disp: 90 tablet, Rfl: 0 .  DULoxetine (CYMBALTA) 60 MG capsule, Take 1 capsule (60 mg total) by mouth daily., Disp: 90 capsule, Rfl: 3 .  hydrochlorothiazide (HYDRODIURIL) 25 MG tablet, Take 25 mg by mouth daily., Disp: , Rfl:  .  hydrOXYzine (ATARAX/VISTARIL) 25 MG tablet, Take 1 tablet (25 mg total) by mouth 3 (three) times daily as needed., Disp: 180 tablet, Rfl: 2 .  indomethacin (INDOCIN) 25 MG capsule, One po tid prn headache, Disp: 90 capsule, Rfl: 0 .  KRILL OIL/ASTAXANTHIN 1000 MG CAPS, Take 500 mg by mouth 2 (two) times a day. , Disp: , Rfl:  .  lamoTRIgine (LAMICTAL) 100 MG tablet, Take 1 tablet (100 mg total) by mouth 2 (two) times daily., Disp: 180 tablet, Rfl: 3 .  meloxicam (MOBIC) 7.5 MG tablet, One po qd prn knee pain, Disp: 90 tablet, Rfl: 3 .  methylPREDNISolone (MEDROL DOSEPAK) 4 MG TBPK tablet, Take 6 tablets on day 1 and decrease by 1 tablet each day until finished, Disp: 21 tablet, Rfl: 0 .  PROAIR HFA 108 (90 Base) MCG/ACT inhaler, , Disp: , Rfl:  .  Specialty Vitamins Products (MAGNESIUM, AMINO ACID CHELATE,) 133 MG tablet, Take 1 tablet by mouth 2 (two) times daily., Disp: , Rfl:  .  Vitamin D, Cholecalciferol, 1000 UNITS CAPS, Take 2,000 Units by mouth daily., Disp: , Rfl:   PAST MEDICAL HISTORY: Past Medical History:  Diagnosis Date  . Movement disorder   . Multiple sclerosis ([place])    . Vision abnormalities     PAST SURGICAL HISTORY: Past Surgical History:  Procedure Laterality Date  . CERVICAL CONE BIOPSY    . CHOLECYSTECTOMY, LAPAROSCOPIC      FAMILY HISTORY: Family History  Problem Relation Age of Onset  . Diabetes type II Mother   . Arthritis Mother   . Heart failure Father   . Diabetes type II Father     SOCIAL HISTORY:  Social History   Socioeconomic History  . Marital status: Divorced    Spouse name: Not on file  . Number of children: Not on file  . Years of education: Not on file  . Highest education level: Not on file  Occupational History  . Not on file  Tobacco Use  . Smoking status: Former Smoker    Packs/day: 0.50    Types: Cigarettes  . Smokeless tobacco: Never Used  Substance and Sexual Activity  . Alcohol use: Yes    Alcohol/week: 0.0 standard drinks    Comment: occasional/fim  . Drug use: No  . Sexual activity: Not on file  Other Topics Concern  . Not on file  Social History Narrative  . Not on file   Social Determinants of Health   Financial Resource Strain:   . Difficulty of Paying Living Expenses:   Food Insecurity:   . Worried About Charity fundraiser in the Last Year:   . Arboriculturist in the Last Year:   Transportation Needs:   . Film/video editor (Medical):   Marland Kitchen Lack of Transportation (Non-Medical):   Physical Activity:   . Days of Exercise per Week:   . Minutes of Exercise per Session:  Stress:   . Feeling of Stress :   Social Connections:   . Frequency of Communication with Friends and Family:   . Frequency of Social Gatherings with Friends and Family:   . Attends Religious Services:   . Active Member of Clubs or Organizations:   . Attends Banker Meetings:   Marland Kitchen Marital Status:   Intimate Partner Violence:   . Fear of Current or Ex-Partner:   . Emotionally Abused:   Marland Kitchen Physically Abused:   . Sexually Abused:      PHYSICAL EXAM  Vitals:   07/27/19 1302  BP: 128/73   Pulse: 77  Weight: 195 lb (88.5 kg)  Height: 5\' 3"  (1.6 m)    Body mass index is 34.54 kg/m.   General: The patient is well-developed and well-nourished and in no acute distress   Neurologic Exam  Mental status: The patient is alert and oriented x 3 at the time of the examination. The patient has apparent normal recent and remote memory, with an apparently normal attention span and concentration ability.   Speech is normal.  Cranial nerves: Extraocular movements are full.  Facial strength and sensation was normal.  Trapezius strength was normal.   No obvious hearing deficits are noted.  Motor:  Muscle bulk is normal.   She has mildly increased tone in the legs, R>L. Strength is  5 / 5 in arms and legs.  Sensory: She has intact sensation to touch in the arms and lower legs   Coordination: Cerebellar testing shows good finger-nose-finger.  Gait and station: Station is normal.   Gait is mildly wide and the tandem gait is moderately wide.  Romberg is negative..   Reflexes: Deep tendon reflexes are normal in the arms but mildly  increased in right leg.      Multiple sclerosis (HCC)  Gait disturbance  Recurrent major depressive disorder, remission status unspecified (HCC)  Depression with anxiety  Attention deficit disorder, unspecified hyperactivity presence  1.   She will stop MS DMTs and we will recheck an MRi around May/June 2022 to see if there is any subclinical activity.  If present, consider restarting a different disease modifying therapy. 2.   Refill other medications. 3.   Stay active and exercise as tolerated 5.   return in 6 months or sooner if there are new or worseningneurologic symptoms.    Tierney Behl A. July 2022, MD, PhD 07/27/2019, 1:45 PM Certified in Neurology, Clinical Neurophysiology, Sleep Medicine, Pain Medicine and Neuroimaging  [facility] [address], [address] [place], [place] [phone]

## 2019-08-29 ENCOUNTER — Telehealth: Payer: Self-pay | Admitting: Neurology

## 2019-08-29 NOTE — Telephone Encounter (Signed)
Patient called on call physician  complaining of exacerbation, I failed to reach patient, left message for her to call back

## 2019-08-30 NOTE — Telephone Encounter (Signed)
Called pt back. Relayed Dr. Sater's recommendation. She verbalized understanding and appreciation. 

## 2019-08-30 NOTE — Telephone Encounter (Signed)
Since she is doing better today I would not do anything yet. Hopefully she will feel even better as the day goes on and into tomorrow.  If she does feel that she continues to worsen she may need to go to the emergency room there to be evaluated. It would be unlikely to have an exacerbation that is affecting gait as well as speech. However, if she feels she is worse tomorrow I can call in a high-dose prednisone pack

## 2019-08-30 NOTE — Telephone Encounter (Signed)
Pt called office back. She had phone on airplane mode by mistake yesterday and just saw she missed call from Dr. Terrace Arabia. She was having difficulty walking yesterday and could only take baby steps. She got stuck across the street, was very shaky. She ended up sitting down at neighbors porch and then got help to get back home. She has never had this happen before. She ambulates with cane, has balance issues normally. But this was more extreme than normal. Denies any signs of infection. 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  She is able to take bigger steps today. She cannot walk long distances still. She is not back to her baseline yet. Hard for her talk, find words to answer questions. Denies starting any new medications recently. Stopped Vumerity at last OV. She is currently still in [place] at her sister's house. If MD wants to call anything in she would like it sent to [facility] at [address] [place], [place] [address]. Advised I will send message to MD and call back with recommendation. She verbalized understanding.

## 2019-10-12 ENCOUNTER — Telehealth: Payer: Self-pay | Admitting: Neurology

## 2019-10-12 NOTE — Telephone Encounter (Signed)
Records faxed to [facility] 10/12/2019.

## 2019-11-24 ENCOUNTER — Telehealth: Payer: Self-pay | Admitting: Neurology

## 2019-11-24 NOTE — Telephone Encounter (Signed)
Called pt. She reports intermittent weakness and more cognitive changes. Usually, only her recall is affected, but now she cannot remember most things. Uses a calender and writes things down to try and help. She has been choking more, had 1 episode of incontinence of bowel. She is taking dextroamphetamine daily and has been having to take more clonazepam d/t increased frustration of worsening sx. Advised it would be best to get her in for a sooner appt for evaluation by Dr. Epimenio Foot. Scheduled appt with her for 11/29/19 at 2:30pm. Asked that she check in by 2:00pm. She verbalized understanding.   She is worried about now being able to go on trip to see her sister. Will discuss this at appt with MD.

## 2019-11-24 NOTE — Telephone Encounter (Signed)
Pt called, since the outbreak in July my cognitive symptoms are bad and having weakness. Is there anything I can do to improve my symptoms, so I can go see my sister? Would like a call from the nurse.

## 2019-11-29 ENCOUNTER — Telehealth: Payer: Self-pay | Admitting: Neurology

## 2019-11-29 ENCOUNTER — Other Ambulatory Visit: Payer: Self-pay

## 2019-11-29 ENCOUNTER — Ambulatory Visit: Payer: Medicare HMO | Admitting: Neurology

## 2019-11-29 ENCOUNTER — Encounter: Payer: Self-pay | Admitting: Neurology

## 2019-11-29 VITALS — BP 121/78 | HR 88 | Ht 63.0 in | Wt 201.0 lb

## 2019-11-29 DIAGNOSIS — G35 Multiple sclerosis: Secondary | ICD-10-CM

## 2019-11-29 DIAGNOSIS — R208 Other disturbances of skin sensation: Secondary | ICD-10-CM

## 2019-11-29 DIAGNOSIS — F322 Major depressive disorder, single episode, severe without psychotic features: Secondary | ICD-10-CM | POA: Diagnosis not present

## 2019-11-29 DIAGNOSIS — G4700 Insomnia, unspecified: Secondary | ICD-10-CM

## 2019-11-29 DIAGNOSIS — R269 Unspecified abnormalities of gait and mobility: Secondary | ICD-10-CM

## 2019-11-29 MED ORDER — ARIPIPRAZOLE 5 MG PO TABS: 5.0000 mg | ORAL_TABLET | Freq: Every day | ORAL | 5 refills | Status: DC

## 2019-11-29 MED ORDER — DEXTROAMPHETAMINE SULFATE 10 MG PO TABS: ORAL_TABLET | ORAL | 0 refills | Status: DC

## 2019-11-29 NOTE — Progress Notes (Signed)
[facility]  PATIENT: Jill Rogers DOB: 06-May-1955  REFERRING DOCTOR OR PCP:  None SOURCE: patient  _________________________________   HISTORICAL  CHIEF COMPLAINT:  Chief Complaint  Patient presents with  . Follow-up    RM 13, alone. Last seen 07/27/2019.   . Multiple Sclerosis    Off DMT. Having intermittent weakness, cognitive changes, choking, has had 1 episode of incontinence of bowel.  . Gait Problem    Ambulates with quad cane.    HISTORY OF PRESENT ILLNESS:  Jill Rogers is a 64 y.o. female with relapsing remitting multiple sclerosis.     Update 11/29/2019: She discontinued Vumerity earlier this year.  Her left clear-cut exacerbation had been 7 or 8 years earlier.  MRIs have been stable.    She had an episode August 27, 2019, in [place] [address] by helping her sister, where she felt she could not move despite standing at the time.  This went on for about 5-6 hours.    The next day she was better but still felt very tired.   She also felt that it was difficult getting words out.   Focus and attention was poor. She felt it was difficult to figure out how to get dressed.   She felt stressed at the time of the episode (sistrer needed to go to the hospital and she was dog sitting.      This week, she had a milder episode while walking her dog where she felt she couldn't walk as well.     Since then, she has felt very tired and poorly focused.    She has had 3 falls, with 2 trips more recently falling off the couch when she reached forward to get something off the floor.    She wants to do activities but feels too tired to do so.     She takes dextroamphetamine 10 mg most days but we had discussed increasing to bid.     She also feels gait is worse and she is using her cane more.      She has more depression and is unhappy with life.  She feels QOL is poor.  She is on Cymbalta. And lamotrigine   She is not seeing behavioral health.    Throughout today's visit she  is constantly fidgeting and shaking legs.    She is having a lot of family stress with her sister having stage 4 cancer and her mom recently breaking her arm.  She helps to take care of both and they are in different cities.      MRI 04/18/2019 showed "T2/FLAIR hyperintense foci in the hemispheres and also a few foci in the pons and cerebellum in a pattern and configuration consistent with chronic demyelinating plaque associated with multiple sclerosis.  None of the foci enhance after contrast.  Compared to the MRI from 2016, there is no definite interim change.  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 "  She has had her Covid vaccination and booster more recently.    We discussed trying to get out more.      MS History:  She was diagnosed in 1983 after she presented with right hand weakness and a limp.   She had an MRI consistent with MS and she did not need to have a LP.    Those symptoms improved after a few weeks.     She was initially placed on Symmetrel for her fatigue and received some steroids (  oral or IV) several times.    She was started on Avonex in 1994.   She switched to Rebif 5 years ago after an exacerbation treated with IV solu-medrol.    Also during that time, her husband left and she was under a lot of stress.    She started seeing me around 2013.  Her last exacerbation was 2014 with some gait issues.   She tolerates Rebif well but has had some bruising at times.       She switched to Vumerity in 2020.  Due to side effects and being stable for many years, DMT's were discontinued in 2021.  REVIEW OF SYSTEMS: Constitutional: No fevers, chills, sweats, or change in appetite.   Fatigue.   She reports excessive sleepines Eyes: No visual changes.   Notes  double vision.  No eye pain Ear, nose and throat: No hearing loss, ear pain, nasal congestion, sore throat.  Recent ear infection Cardiovascular: No chest pain, palpitations Respiratory:  No shortness of breath at rest or with exertion.   No wheezes GastrointestinaI: No nausea, vomiting, diarrhea, abdominal pain, fecal incontinence Genitourinary:see above Musculoskeletal: Mild neck pain, back pain.   Muscles stiff Integumentary: No rash, pruritus.   Has boils/abscesses Neurological: as above Psychiatric: see above Endocrine: No palpitations, diaphoresis, change in appetite, change in weigh or increased thirst Hematologic/Lymphatic: No anemia, petechiae.   Bruises easily at injeciton sites Allergic/Immunologic: No itchy/runny eyes, nasal congestion, recent allergic reactions, rashes  ALLERGIES: Allergies  Allergen Reactions  . Hydrocodone Nausea And Vomiting  . Oxycodone Nausea And Vomiting  . Tape     bandaids rip her skin/fim  . Vumerity [Diroximel Fumarate]     welts    HOME MEDICATIONS:  Current Outpatient Medications:  .  Amantadine HCl 100 MG tablet, Take 1 tablet (100 mg total) by mouth 2 (two) times daily., Disp: 180 tablet, Rfl: 3 .  atorvastatin (LIPITOR) 10 MG tablet, Take 10 mg by mouth daily., Disp: , Rfl: 0 .  b complex vitamins capsule, Take 1 capsule by mouth daily., Disp: , Rfl:  .  BREO ELLIPTA 100-25 MCG/INH AEPB, Inhale 1 puff into the lungs daily., Disp: , Rfl: 0 .  Camphor-Menthol-Methyl Sal (SALONPAS EX), Apply 1 application topically as needed (for knee)., Disp: , Rfl:  .  Cholecalciferol (VITAMIN D3 PO), Take 1,000 Units by mouth in the morning and at bedtime., Disp: , Rfl:  .  clonazePAM (KLONOPIN) 0.5 MG tablet, Take 1 tablet (0.5 mg total) by mouth 2 (two) times daily as needed for anxiety., Disp: 60 tablet, Rfl: 5 .  co-enzyme Q-10 30 MG capsule, Take 200 mg by mouth daily. , Disp: , Rfl:  .  dextroamphetamine (DEXTROSTAT) 10 MG tablet, Take up to 3 tablets daily as needed., Disp: 90 tablet, Rfl: 0 .  diphenhydrAMINE (BENADRYL) 25 MG tablet, Take 25 mg by mouth as needed for allergies., Disp: , Rfl:  .  DULoxetine (CYMBALTA) 60 MG  capsule, Take 1 capsule (60 mg total) by mouth daily., Disp: 90 capsule, Rfl: 3 .  ELDERBERRY PO, Take 1 Dose by mouth as needed. Takes 1tsp when needed, Disp: , Rfl:  .  hydrochlorothiazide (HYDRODIURIL) 25 MG tablet, Take 25 mg by mouth daily., Disp: , Rfl:  .  hydrOXYzine (ATARAX/VISTARIL) 25 MG tablet, Take 1 tablet (25 mg total) by mouth 3 (three) times daily as needed. (Patient taking differently: Take 25 mg by mouth daily. ), Disp: 180 tablet, Rfl: 2 .  indomethacin (INDOCIN) 25 MG  capsule, One po tid prn headache, Disp: 90 capsule, Rfl: 0 .  KRILL OIL/ASTAXANTHIN 1000 MG CAPS, Take 500 mg by mouth 2 (two) times a day. , Disp: , Rfl:  .  lamoTRIgine (LAMICTAL) 100 MG tablet, Take 1 tablet (100 mg total) by mouth 2 (two) times daily., Disp: 180 tablet, Rfl: 3 .  meloxicam (MOBIC) 7.5 MG tablet, One po qd prn knee pain (Patient taking differently: Take 7.5 mg by mouth daily. One po qd prn knee pain), Disp: 90 tablet, Rfl: 3 .  OVER THE COUNTER MEDICATION, Take 1 Dose by mouth as needed. Hemp seed/metcha, Disp: , Rfl:  .  pantoprazole (PROTONIX) 40 MG tablet, Take 40 mg by mouth daily., Disp: , Rfl:  .  PROAIR HFA 108 (90 Base) MCG/ACT inhaler, , Disp: , Rfl:  .  ARIPiprazole (ABILIFY) 5 MG tablet, Take 1 tablet (5 mg total) by mouth daily., Disp: 30 tablet, Rfl: 5  PAST MEDICAL HISTORY: Past Medical History:  Diagnosis Date  . Movement disorder   . Multiple sclerosis (HCC)   . Vision abnormalities     PAST SURGICAL HISTORY: Past Surgical History:  Procedure Laterality Date  . CERVICAL CONE BIOPSY    . CHOLECYSTECTOMY, LAPAROSCOPIC      FAMILY HISTORY: Family History  Problem Relation Age of Onset  . Diabetes type II Mother   . Arthritis Mother   . Heart failure Father   . Diabetes type II Father     SOCIAL HISTORY:  Social History   Socioeconomic History  . Marital status: Divorced    Spouse name: Not on file  . Number of children: Not on file  . Years of education:  Not on file  . Highest education level: Not on file  Occupational History  . Not on file  Tobacco Use  . Smoking status: Former Smoker    Packs/day: 0.50    Types: Cigarettes  . Smokeless tobacco: Never Used  Substance and Sexual Activity  . Alcohol use: Yes    Alcohol/week: 0.0 standard drinks    Comment: occasional/fim  . Drug use: No  . Sexual activity: Not on file  Other Topics Concern  . Not on file  Social History Narrative  . Not on file   Social Determinants of Health   Financial Resource Strain:   . Difficulty of Paying Living Expenses: Not on file  Food Insecurity:   . Worried About Programme researcher, broadcasting/film/video in the Last Year: Not on file  . Ran Out of Food in the Last Year: Not on file  Transportation Needs:   . Lack of Transportation (Medical): Not on file  . Lack of Transportation (Non-Medical): Not on file  Physical Activity:   . Days of Exercise per Week: Not on file  . Minutes of Exercise per Session: Not on file  Stress:   . Feeling of Stress : Not on file  Social Connections:   . Frequency of Communication with Friends and Family: Not on file  . Frequency of Social Gatherings with Friends and Family: Not on file  . Attends Religious Services: Not on file  . Active Member of Clubs or Organizations: Not on file  . Attends Banker Meetings: Not on file  . Marital Status: Not on file  Intimate Partner Violence:   . Fear of Current or Ex-Partner: Not on file  . Emotionally Abused: Not on file  . Physically Abused: Not on file  . Sexually Abused: Not on file  PHYSICAL EXAM  Vitals:   11/29/19 1420  BP: 121/78  Pulse: 88  SpO2: 96%  Weight: 201 lb (91.2 kg)  Height: 5\' 3"  (1.6 m)    Body mass index is 35.61 kg/m.   General: The patient is well-developed and well-nourished who appears agitated with fidgeting and constant shaking of the leg   Neurologic Exam  Mental status: The patient is alert and oriented x 3 at the time of the  examination. The patient has apparent normal recent and remote memory, with an apparently normal attention span and concentration ability.   Speech is normal.  Cranial nerves: Extraocular movements are full.  Facial strength and sensation was normal.  Trapezius strength was normal.   No obvious hearing deficits are noted.  Motor:  Muscle bulk is normal.   She has mildly increased tone in the legs, R>L.Marland Kitchen Strength is  5 / 5 in arms and legs.  Sensory: She has intact sensation to touch in the arms and lower legs   Coordination: Cerebellar testing shows good finger-nose-finger.  Gait and station: Station is normal.   Gait is mildly wide and the tandem gait is moderately wide.  Romberg is negative..   Reflexes: Deep tendon reflexes are normal in the arms but mildly  increased in right leg.      Agitated depression (HCC) - Plan: Ambulatory referral to Behavioral Health  Multiple sclerosis [facility]) - Plan: MR BRAIN W WO CONTRAST, Ambulatory referral to Behavioral Health  Gait disturbance  Dysesthesia  Insomnia, unspecified type  1.   Due to new symptoms we will check an MRI of the brain to determine if she is having any subclinical progression.  This is occurring we would need to reinitiate a disease modifying therapy.   2.   She has a more agitated depression at the current time.  I will add Abilify 5 mg and see if we can get her in to see behavioral health.   3.   Stay active and exercise as tolerated 4.   return in 6 months or sooner if there are new or worseningneurologic symptoms.    45-minute office visit with the majority of the time spent face-to-face for history and physical, discussion/counseling and decision-making.  Additional time with record review and documentation.   Sharell Hilmer A. Epimenio Foot, MD, PhD 11/29/2019, 5:36 PM Certified in Neurology, Clinical Neurophysiology, Sleep Medicine, Pain Medicine and Neuroimaging  [facility] [address], [address] [place], [place] [address] [phone]

## 2019-11-29 NOTE — Telephone Encounter (Signed)
[employer] medicare order sent to [facility]. They will obtain the auth and reach out to the patient to schedule.  °

## 2019-12-10 ENCOUNTER — Ambulatory Visit
Admission: RE | Admit: 2019-12-10 | Discharge: 2019-12-10 | Disposition: A | Discharge status: Home / Self Care | Payer: Medicare HMO | Source: Ambulatory Visit | Attending: Neurology | Admitting: Neurology

## 2019-12-10 ENCOUNTER — Other Ambulatory Visit: Payer: Self-pay

## 2019-12-10 DIAGNOSIS — G35 Multiple sclerosis: Secondary | ICD-10-CM

## 2019-12-10 DIAGNOSIS — G35D Multiple sclerosis, unspecified: Secondary | ICD-10-CM

## 2019-12-10 MED ORDER — GADOBENATE DIMEGLUMINE 529 MG/ML IV SOLN
18.0000 mL | Freq: Once | INTRAVENOUS | Status: AC | PRN
  Administered 2019-12-10: 18 mL via INTRAVENOUS

## 2019-12-12 ENCOUNTER — Telehealth: Payer: Self-pay | Admitting: *Deleted

## 2019-12-12 DIAGNOSIS — G35 Multiple sclerosis: Secondary | ICD-10-CM

## 2019-12-12 DIAGNOSIS — R269 Unspecified abnormalities of gait and mobility: Secondary | ICD-10-CM

## 2019-12-12 NOTE — Telephone Encounter (Signed)
Order printed, signed by Dr. Epimenio Foot and mailed to patient's verified home address.

## 2019-12-12 NOTE — Addendum Note (Signed)
Addended by: Lindell Spar C on: 12/12/2019 10:52 AM   Modules accepted: Orders

## 2019-12-12 NOTE — Telephone Encounter (Signed)
-----   Message from Asa Lente, MD sent at 12/11/2019  3:59 PM EDT ----- Please let her know that the MRI of the brain did not show any new lesions compared to her previous MRI from earlier this year.

## 2019-12-12 NOTE — Telephone Encounter (Signed)
I have spoken to the patient and she verbalized understanding of the MRI findings.  She had several falls over the weekend but luckily no significant injuries. She bruised one of her toes and has sorness. She is using a four pronged cane now but she would like to proceed with getting a rolling walker w/ seat.   She is asking for a prescription be mailed to her home.

## 2019-12-27 ENCOUNTER — Other Ambulatory Visit: Payer: Self-pay | Admitting: Neurology

## 2020-01-16 ENCOUNTER — Ambulatory Visit: Payer: Medicare HMO | Admitting: Neurology

## 2020-01-19 ENCOUNTER — Ambulatory Visit (INDEPENDENT_AMBULATORY_CARE_PROVIDER_SITE_OTHER): Payer: Medicare HMO | Admitting: Psychology

## 2020-01-19 DIAGNOSIS — F339 Major depressive disorder, recurrent, unspecified: Secondary | ICD-10-CM | POA: Diagnosis not present

## 2020-01-24 ENCOUNTER — Other Ambulatory Visit: Payer: Self-pay | Admitting: Neurology

## 2020-02-22 ENCOUNTER — Ambulatory Visit (INDEPENDENT_AMBULATORY_CARE_PROVIDER_SITE_OTHER): Payer: Medicare HMO | Admitting: Psychology

## 2020-02-22 ENCOUNTER — Other Ambulatory Visit: Payer: Self-pay | Admitting: *Deleted

## 2020-02-22 DIAGNOSIS — F339 Major depressive disorder, recurrent, unspecified: Secondary | ICD-10-CM | POA: Diagnosis not present

## 2020-02-22 MED ORDER — CLONAZEPAM 0.5 MG PO TABS: 0.5000 mg | ORAL_TABLET | Freq: Two times a day (BID) | ORAL | 5 refills | Status: DC | PRN

## 2020-02-23 ENCOUNTER — Other Ambulatory Visit: Payer: Self-pay | Admitting: *Deleted

## 2020-02-23 MED ORDER — MELOXICAM 7.5 MG PO TABS: ORAL_TABLET | ORAL | 3 refills | Status: DC

## 2020-03-08 ENCOUNTER — Ambulatory Visit (INDEPENDENT_AMBULATORY_CARE_PROVIDER_SITE_OTHER): Payer: Medicare HMO | Admitting: Psychology

## 2020-03-08 DIAGNOSIS — F339 Major depressive disorder, recurrent, unspecified: Secondary | ICD-10-CM

## 2020-03-21 ENCOUNTER — Ambulatory Visit (INDEPENDENT_AMBULATORY_CARE_PROVIDER_SITE_OTHER): Payer: Medicare HMO | Admitting: Psychology

## 2020-03-21 DIAGNOSIS — F339 Major depressive disorder, recurrent, unspecified: Secondary | ICD-10-CM

## 2020-03-30 ENCOUNTER — Encounter: Payer: Self-pay | Admitting: Neurology

## 2020-03-30 ENCOUNTER — Ambulatory Visit: Payer: Medicare HMO | Admitting: Neurology

## 2020-03-30 VITALS — BP 138/80 | HR 91 | Ht 63.0 in | Wt 207.5 lb

## 2020-03-30 DIAGNOSIS — R269 Unspecified abnormalities of gait and mobility: Secondary | ICD-10-CM

## 2020-03-30 DIAGNOSIS — G35 Multiple sclerosis: Secondary | ICD-10-CM | POA: Diagnosis not present

## 2020-03-30 DIAGNOSIS — F988 Other specified behavioral and emotional disorders with onset usually occurring in childhood and adolescence: Secondary | ICD-10-CM

## 2020-03-30 DIAGNOSIS — G4700 Insomnia, unspecified: Secondary | ICD-10-CM

## 2020-03-30 DIAGNOSIS — F322 Major depressive disorder, single episode, severe without psychotic features: Secondary | ICD-10-CM | POA: Diagnosis not present

## 2020-03-30 MED ORDER — ARIPIPRAZOLE 5 MG PO TABS: 5.0000 mg | ORAL_TABLET | Freq: Every day | ORAL | 5 refills | Status: DC

## 2020-03-30 MED ORDER — AMANTADINE HCL 100 MG PO TABS: 100.0000 mg | ORAL_TABLET | Freq: Two times a day (BID) | ORAL | 3 refills | Status: DC

## 2020-03-30 MED ORDER — DEXTROAMPHETAMINE SULFATE 10 MG PO TABS: ORAL_TABLET | ORAL | 0 refills | Status: DC

## 2020-03-30 MED ORDER — MELOXICAM 7.5 MG PO TABS: ORAL_TABLET | ORAL | 1 refills | Status: DC

## 2020-03-30 MED ORDER — LAMOTRIGINE 100 MG PO TABS: 100.0000 mg | ORAL_TABLET | Freq: Two times a day (BID) | ORAL | 3 refills | Status: DC

## 2020-03-30 MED ORDER — DULOXETINE HCL 60 MG PO CPEP: 60.0000 mg | ORAL_CAPSULE | Freq: Every day | ORAL | 3 refills | Status: DC

## 2020-03-30 NOTE — Progress Notes (Signed)
[facility]  PATIENT: Jill Rogers DOB: November 02, 1955  REFERRING DOCTOR OR PCP:  None SOURCE: patient  _________________________________   HISTORICAL  CHIEF COMPLAINT:  Chief Complaint  Patient presents with  . Follow-up    RM 12.  Last seen 11/29/2019. Off DMT for MS.Ambulating with rolling walker.     HISTORY OF PRESENT ILLNESS:  Jill Rogers is a 65 y.o. female with relapsing remitting multiple sclerosis.     Update 03/30/2020: She discontinued Vumerity earlier this year.  Her left clear-cut exacerbation had been 7 or 8 years earlier.  MRIs have been stable.    The MRI 04/18/2019 showed no bew lesions compared to 2016.    She feels her cognitive fog is worse.   She has felt very tired and poorly focused.     She takes dextroamphetamine 10 mg once most days sometimes bid.     She ran out of dexedrine a month ago and has done much worse since.      She feels gait is better with the walker rather than the cane.    She has had no recent falls   She wants to do activities but feels too tired to do so.      She feels mood is better since Abilify was added to duloxetine and lamotrigine.   She feels QOL is poor.  She is on Cymbalta and lamotrigine   She is not seeing psychiatry but sees a counselor behavioral health.        She has had her Covid vaccination and booster .   MS History:   She was diagnosed in 1983 after she presented with right hand weakness and a limp.   She had an MRI consistent with MS and she did not need to have a LP.    Those symptoms improved after a few weeks.     She was initially placed on Symmetrel for her fatigue and received some steroids (oral or IV) several times.    She was started on Avonex in 1994.   She switched to Rebif 5 years ago after an exacerbation treated with IV solu-medrol.    Also during that time, her husband left and she was under a lot of stress.    She started seeing me around 2013.  Her last exacerbation was 2014 with  some gait issues.   She tolerates Rebif well but has had some bruising at times.       She switched to Vumerity in 2020.  Due to side effects and being stable for many years, DMT's were discontinued in 2021.  IMAGING MRI 04/18/2019 showed T2/FLAIR hyperintense foci in the hemispheres and also a few foci in the pons and cerebellum in a pattern and configuration consistent with chronic demyelinating plaque associated with multiple sclerosis.  None of the foci enhance after contrast.  Compared to the MRI from 2016, there is no definite interim change.  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  MRI of the brain 12/10/2019 showed no new lesions.  MRI of the cervical spine 03/28/2016 showed a normal spinal cord and degenerative changes that could affect the C5 nerve roots at C4-C5 and the left C6 nerve root at C5-C6   REVIEW OF SYSTEMS: Constitutional: No fevers, chills, sweats, or change in appetite.   Fatigue.   She reports excessive sleepines Eyes: No visual changes.   Notes  double vision.  No eye pain Ear, nose and throat: No hearing  loss, ear pain, nasal congestion, sore throat.  Recent ear infection Cardiovascular: No chest pain, palpitations Respiratory: No shortness of breath at rest or with exertion.   No wheezes GastrointestinaI: No nausea, vomiting, diarrhea, abdominal pain, fecal incontinence Genitourinary:see above Musculoskeletal: Mild neck pain, back pain.   Muscles stiff Integumentary: No rash, pruritus.   Has boils/abscesses Neurological: as above Psychiatric: see above Endocrine: No palpitations, diaphoresis, change in appetite, change in weigh or increased thirst Hematologic/Lymphatic: No anemia, petechiae.   Bruises easily at injeciton sites Allergic/Immunologic: No itchy/runny eyes, nasal congestion, recent allergic reactions, rashes  ALLERGIES: Allergies  Allergen Reactions  . Hydrocodone Nausea And Vomiting  .  Oxycodone Nausea And Vomiting  . Tape     bandaids rip her skin/fim  . Vumerity [Diroximel Fumarate]     welts    HOME MEDICATIONS:  Current Outpatient Medications:  .  atorvastatin (LIPITOR) 10 MG tablet, Take 10 mg by mouth daily., Disp: , Rfl: 0 .  b complex vitamins capsule, Take 1 capsule by mouth daily., Disp: , Rfl:  .  BREO ELLIPTA 100-25 MCG/INH AEPB, Inhale 1 puff into the lungs daily., Disp: , Rfl: 0 .  Camphor-Menthol-Methyl Sal (SALONPAS EX), Apply 1 application topically as needed (for knee)., Disp: , Rfl:  .  Cholecalciferol (VITAMIN D3 PO), Take 1,000 Units by mouth in the morning and at bedtime., Disp: , Rfl:  .  clonazePAM (KLONOPIN) 0.5 MG tablet, Take 1 tablet (0.5 mg total) by mouth 2 (two) times daily as needed for anxiety., Disp: 60 tablet, Rfl: 5 .  co-enzyme Q-10 30 MG capsule, Take 200 mg by mouth daily. , Disp: , Rfl:  .  diphenhydrAMINE (BENADRYL) 25 MG tablet, Take 25 mg by mouth as needed for allergies., Disp: , Rfl:  .  ELDERBERRY PO, Take 1 Dose by mouth as needed. Takes 1tsp when needed, Disp: , Rfl:  .  hydrochlorothiazide (HYDRODIURIL) 25 MG tablet, Take 25 mg by mouth daily., Disp: , Rfl:  .  hydrOXYzine (ATARAX/VISTARIL) 25 MG tablet, Take 1 tablet (25 mg total) by mouth 3 (three) times daily as needed. (Patient taking differently: Take 25 mg by mouth daily.), Disp: 180 tablet, Rfl: 2 .  indomethacin (INDOCIN) 25 MG capsule, One po tid prn headache, Disp: 90 capsule, Rfl: 0 .  KRILL OIL/ASTAXANTHIN 1000 MG CAPS, Take 500 mg by mouth 2 (two) times a day. , Disp: , Rfl:  .  OVER THE COUNTER MEDICATION, Take 1 Dose by mouth as needed. Hemp seed/metcha, Disp: , Rfl:  .  pantoprazole (PROTONIX) 40 MG tablet, Take 40 mg by mouth daily., Disp: , Rfl:  .  PROAIR HFA 108 (90 Base) MCG/ACT inhaler, , Disp: , Rfl:  .  Amantadine HCl 100 MG tablet, Take 1 tablet (100 mg total) by mouth 2 (two) times daily., Disp: 180 tablet, Rfl: 3 .  ARIPiprazole (ABILIFY) 5 MG  tablet, Take 1 tablet (5 mg total) by mouth daily., Disp: 30 tablet, Rfl: 5 .  dextroamphetamine (DEXTROSTAT) 10 MG tablet, Take up to 3 tablets daily as needed., Disp: 90 tablet, Rfl: 0 .  DULoxetine (CYMBALTA) 60 MG capsule, Take 1 capsule (60 mg total) by mouth daily., Disp: 90 capsule, Rfl: 3 .  lamoTRIgine (LAMICTAL) 100 MG tablet, Take 1 tablet (100 mg total) by mouth 2 (two) times daily., Disp: 180 tablet, Rfl: 3 .  meloxicam (MOBIC) 7.5 MG tablet, One po qd prn knee pain, Disp: 90 tablet, Rfl: 1  PAST MEDICAL HISTORY: Past Medical  History:  Diagnosis Date  . Movement disorder   . Multiple sclerosis (HCC)   . Vision abnormalities     PAST SURGICAL HISTORY: Past Surgical History:  Procedure Laterality Date  . CERVICAL CONE BIOPSY    . CHOLECYSTECTOMY, LAPAROSCOPIC      FAMILY HISTORY: Family History  Problem Relation Age of Onset  . Diabetes type II Mother   . Arthritis Mother   . Heart failure Father   . Diabetes type II Father     SOCIAL HISTORY:  Social History   Socioeconomic History  . Marital status: Divorced    Spouse name: Not on file  . Number of children: Not on file  . Years of education: Not on file  . Highest education level: Not on file  Occupational History  . Not on file  Tobacco Use  . Smoking status: Former Smoker    Packs/day: 0.50    Types: Cigarettes  . Smokeless tobacco: Never Used  Substance and Sexual Activity  . Alcohol use: Yes    Alcohol/week: 0.0 standard drinks    Comment: occasional/fim  . Drug use: No  . Sexual activity: Not on file  Other Topics Concern  . Not on file  Social History Narrative  . Not on file   Social Determinants of Health   Financial Resource Strain: Not on file  Food Insecurity: Not on file  Transportation Needs: Not on file  Physical Activity: Not on file  Stress: Not on file  Social Connections: Not on file  Intimate Partner Violence: Not on file     PHYSICAL EXAM  Vitals:   03/30/20  1028  BP: 138/80  Pulse: 91  Weight: 207 lb 8 oz (94.1 kg)  Height: 5\' 3"  (1.6 m)    Body mass index is 36.76 kg/m.   General: The patient is well-developed and well-nourished who appears agitated with fidgeting and constant shaking of the leg   Neurologic Exam  Mental status: The patient is alert and oriented x 3 at the time of the examination. The patient has apparent normal recent and remote memory, with an apparently normal attention span and concentration ability.   Speech is normal.  Cranial nerves: Extraocular movements are full.  Facial strength and sensation was normal.  Trapezius strength was normal.   No obvious hearing deficits are noted.  Motor:  Muscle bulk is normal.   She has mildly increased tone in the legs, R>L.Marland Kitchen Strength is  5 / 5 in arms and legs.  Sensory: She has intact sensation to touch in the arms and lower legs   Coordination: Cerebellar testing shows good finger-nose-finger.  Gait and station: Station is normal.   Gait is mildly wide and the tandem gait is moderately wide.  Romberg is negative..   Reflexes: Deep tendon reflexes are normal in the arms but mildly  increased in right leg.      Multiple sclerosis (HCC)  Gait disturbance  Agitated depression (HCC)  Insomnia, unspecified type  Attention deficit disorder, unspecified hyperactivity presence  1.   She will remain off of a disease modifying therapy.  Her last MRI was stable.  We would need to recheck MRI of the brain and cervical spine later in year around the time of the next visit.  2.   She has a more agitated depression at the current time.  continue Abilify 5 mg and follow with behavioral health.   3.   Stay active and exercise as tolerated 4.  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 5.    return in 6 months or sooner if there are new or worsening neurologic symptoms.    45-minute office visit with the majority of the time spent face-to-face for history and  physical, discussion/counseling and decision-making.  Additional time with record review and documentation.   Karinda Cabriales A. Epimenio Foot, MD, PhD 03/30/2020, 4:54 PM Certified in Neurology, Clinical Neurophysiology, Sleep Medicine, Pain Medicine and Neuroimaging  [facility] [address] [place], [place] [address] [phone]

## 2020-04-04 ENCOUNTER — Ambulatory Visit: Payer: Medicare HMO | Admitting: Neurology

## 2020-04-05 ENCOUNTER — Ambulatory Visit: Payer: Medicare HMO | Admitting: Psychology

## 2020-04-06 ENCOUNTER — Telehealth: Payer: Self-pay | Admitting: Neurology

## 2020-04-06 NOTE — Telephone Encounter (Signed)
In July she had a problems, she stood up and was so spastic that she could not function, managed to reach for her cane and dragged herself to the phone, it happened again this morning, using her cane she could manage around the house. She is having associated cognitive problems, she will get to where she is walking and she will have no idea why she went to the room. The spasticity is when she first gets up,she feels fine laying down, then moved to a sitting position and felt "achy" and by the time she stood "everything was stiff and shaky" all limbs, I questioned her on focal deficits and she said "everything" nothing focal. It is affecting her thought process and she is in the middle of a thought and "boom the thought is gone" and her "executive function doesn't work".   I think it would be odd for this to be an MS exacerbation, the only way to get imaging emergently is to go to the ED. This is exactly like what happened in July. She improved from the event in July with rest. She declines gong to the emergency room. I advised if she worsens she should be seen urgently.She wants this information sent to Dr. Epimenio Foot. Advised we are here if she needs to call us back.

## 2020-04-06 NOTE — Telephone Encounter (Signed)
Pt called stating that she is having an exacerbation since yesterday. Pt is having extreme spasticity, cognitive and physical problems etc. Pt would like to speak to a provider.

## 2020-04-09 MED ORDER — METHYLPREDNISOLONE 4 MG PO TBPK: ORAL_TABLET | ORAL | 0 refills | Status: DC

## 2020-04-09 NOTE — Addendum Note (Signed)
Addended by: Arther Abbott on: 04/09/2020 01:43 PM   Modules accepted: Orders

## 2020-04-09 NOTE — Telephone Encounter (Signed)
Called pt back. Relayed Dr. Bonnita Hollow message. She verbalized understanding and agreeable to plan. I e-scribed to  [id number] - [place], [place] - [address]

## 2020-04-09 NOTE — Telephone Encounter (Signed)
Though this is probably not an exacerbation.  If the steroids helped her in the past we can send in a standard 6-day steroid pack.  If she does not feel better after a few days, we can schedule an appointment.

## 2020-04-09 NOTE — Telephone Encounter (Signed)
Pt has called back re: the MS exacerbation she had on Friday afternoon.  Pt states normally Dr Epimenio Foot will call in a Prednisone pack for her.  Pt states she doesn't know if its too late at this point, she is asking for a call from RN

## 2020-04-19 ENCOUNTER — Ambulatory Visit (INDEPENDENT_AMBULATORY_CARE_PROVIDER_SITE_OTHER): Payer: Medicare HMO | Admitting: Psychology

## 2020-04-19 DIAGNOSIS — F339 Major depressive disorder, recurrent, unspecified: Secondary | ICD-10-CM

## 2020-04-23 DIAGNOSIS — S42301K Unspecified fracture of shaft of humerus, right arm, subsequent encounter for fracture with nonunion: Secondary | ICD-10-CM | POA: Insufficient documentation

## 2020-04-24 ENCOUNTER — Telehealth: Payer: Self-pay | Admitting: Neurology

## 2020-04-24 NOTE — Telephone Encounter (Signed)
Noted, thanks!

## 2020-04-24 NOTE — Telephone Encounter (Signed)
Pt called wanting to inform provider that she had a fall on Friday the 11th and saw the Ortho on Monday and they informed her that they will operate on Friday the 18th and put some plates and screws in there. This is just an [place]

## 2020-05-03 ENCOUNTER — Ambulatory Visit (INDEPENDENT_AMBULATORY_CARE_PROVIDER_SITE_OTHER): Payer: Medicare HMO | Admitting: Psychology

## 2020-05-03 DIAGNOSIS — F339 Major depressive disorder, recurrent, unspecified: Secondary | ICD-10-CM | POA: Diagnosis not present

## 2020-05-14 ENCOUNTER — Other Ambulatory Visit: Payer: Self-pay | Admitting: Neurology

## 2020-05-17 ENCOUNTER — Ambulatory Visit (INDEPENDENT_AMBULATORY_CARE_PROVIDER_SITE_OTHER): Payer: Medicare HMO | Admitting: Psychology

## 2020-05-17 DIAGNOSIS — F339 Major depressive disorder, recurrent, unspecified: Secondary | ICD-10-CM | POA: Diagnosis not present

## 2020-06-12 ENCOUNTER — Telehealth: Payer: Self-pay | Admitting: *Deleted

## 2020-06-12 NOTE — Telephone Encounter (Signed)
Faxed completed form back to [employer] for PA Abilify. Fax: [phone]. Received fax confirmation, waiting on determination.

## 2020-06-12 NOTE — Telephone Encounter (Signed)
Received fax from [employer] that PA approved 02/11/2020-02/09/2021.

## 2020-06-13 ENCOUNTER — Telehealth: Payer: Self-pay | Admitting: Neurology

## 2020-06-13 DIAGNOSIS — R32 Unspecified urinary incontinence: Secondary | ICD-10-CM

## 2020-06-13 DIAGNOSIS — W19XXXA Unspecified fall, initial encounter: Secondary | ICD-10-CM

## 2020-06-13 DIAGNOSIS — N3281 Overactive bladder: Secondary | ICD-10-CM

## 2020-06-13 MED ORDER — OXYBUTYNIN CHLORIDE 5 MG PO TABS: 5.0000 mg | ORAL_TABLET | Freq: Two times a day (BID) | ORAL | 5 refills | Status: DC

## 2020-06-13 NOTE — Telephone Encounter (Signed)
Called patient back. She fell two days ago on arm that she had surgery on in March. Bruised up, but otherwise ok she feels. She has not contacted Orthopaedic doctor yet. She does have follow up for repeat xray w/ them. She has good ROM. She is also having urinary incontinence 1-2 times per day. It has worsened since fall. Denies any UTI signs/symptoms. Wondering if MD can call in medication for this. I placed her on hold and spoke w/ MD.   MD recommends the following: MRI cervical wo dx: fall, urinary incontinence, oxybutynin 5mg  po BID, pt should call orthopaedic MD to let her know about fall as well/get earlier f/u if needed. I relayed this to pt and she is agreeable to plan. I placed order for MRI/rx for oxybutynin [facility] [place] [facility] pt request.  She will call back if she has any more questions/concerns.

## 2020-06-13 NOTE — Telephone Encounter (Signed)
Pt has asked to be called about a medication being called in to help prevent her from urinating on herself. Also pt wants it noted that she has fallen on her arm again, please call.

## 2020-06-13 NOTE — Addendum Note (Signed)
Addended by: Arther Abbott on: 06/13/2020 05:16 PM   Modules accepted: Orders

## 2020-06-14 ENCOUNTER — Ambulatory Visit (INDEPENDENT_AMBULATORY_CARE_PROVIDER_SITE_OTHER): Payer: Medicare HMO | Admitting: Psychology

## 2020-06-14 ENCOUNTER — Telehealth: Payer: Self-pay | Admitting: Neurology

## 2020-06-14 DIAGNOSIS — F339 Major depressive disorder, recurrent, unspecified: Secondary | ICD-10-CM

## 2020-06-14 NOTE — Telephone Encounter (Signed)
[employer] medicare order sent to [facility]. They will obtain the auth and reach out to the patient to schedule.  °

## 2020-06-23 ENCOUNTER — Other Ambulatory Visit: Payer: Self-pay

## 2020-06-23 ENCOUNTER — Ambulatory Visit
Admission: RE | Admit: 2020-06-23 | Discharge: 2020-06-23 | Disposition: A | Discharge status: Home / Self Care | Payer: Medicare HMO | Source: Ambulatory Visit | Attending: Neurology | Admitting: Neurology

## 2020-06-23 DIAGNOSIS — W19XXXA Unspecified fall, initial encounter: Secondary | ICD-10-CM

## 2020-06-23 DIAGNOSIS — G35 Multiple sclerosis: Secondary | ICD-10-CM

## 2020-06-23 DIAGNOSIS — R32 Unspecified urinary incontinence: Secondary | ICD-10-CM

## 2020-06-25 ENCOUNTER — Telehealth: Payer: Self-pay | Admitting: *Deleted

## 2020-06-25 NOTE — Telephone Encounter (Signed)
-----   Message from Asa Lente, MD sent at 06/24/2020  5:26 PM EDT ----- Please let her know that the MRI of the cervical spine showed some disc and arthritic degenerative changes but there was no progression compared to the 2018 MRI.

## 2020-06-25 NOTE — Telephone Encounter (Signed)
Called and spoke w/ pt about results per Dr Bonnita Hollow note. She verbalized understanding. Oxybutynin has helped a lot for urinary issues. She did f/u with ortho about her arm. Fall caused one bolt to break. They are having her follow back up in 7 weeks. Has 5lb weight restriction.

## 2020-07-05 ENCOUNTER — Ambulatory Visit (INDEPENDENT_AMBULATORY_CARE_PROVIDER_SITE_OTHER): Payer: Medicare HMO | Admitting: Psychology

## 2020-07-05 DIAGNOSIS — F339 Major depressive disorder, recurrent, unspecified: Secondary | ICD-10-CM | POA: Diagnosis not present

## 2020-07-06 ENCOUNTER — Other Ambulatory Visit: Payer: Self-pay | Admitting: Neurology

## 2020-07-26 ENCOUNTER — Ambulatory Visit (INDEPENDENT_AMBULATORY_CARE_PROVIDER_SITE_OTHER): Payer: Medicare HMO | Admitting: Psychology

## 2020-07-26 DIAGNOSIS — F339 Major depressive disorder, recurrent, unspecified: Secondary | ICD-10-CM

## 2020-08-09 ENCOUNTER — Ambulatory Visit: Payer: Medicare HMO | Admitting: Psychology

## 2020-08-15 ENCOUNTER — Other Ambulatory Visit: Payer: Self-pay | Admitting: Neurology

## 2020-09-08 ENCOUNTER — Other Ambulatory Visit: Payer: Self-pay | Admitting: Neurology

## 2020-09-10 ENCOUNTER — Telehealth: Payer: Self-pay | Admitting: Neurology

## 2020-09-10 NOTE — Telephone Encounter (Signed)
Called the patient back and everything is ok. It was a [facility] Informed the patient that this has been done today. She was appreciative for the call back

## 2020-09-10 NOTE — Telephone Encounter (Signed)
Pt called needing to r/s her appt and to also discuss her hydrOXYzine (ATARAX/VISTARIL) 25 MG tablet Pt states that the pharmacy informed her that even though she has refills on it, they are needing to speak with the provider. Please advise.

## 2020-09-13 ENCOUNTER — Other Ambulatory Visit: Payer: Self-pay | Admitting: *Deleted

## 2020-09-13 MED ORDER — LAMOTRIGINE 100 MG PO TABS: 100.0000 mg | ORAL_TABLET | Freq: Two times a day (BID) | ORAL | 1 refills | Status: DC

## 2020-09-27 ENCOUNTER — Ambulatory Visit: Payer: Medicare HMO | Admitting: Neurology

## 2020-10-02 DIAGNOSIS — N9089 Other specified noninflammatory disorders of vulva and perineum: Secondary | ICD-10-CM | POA: Insufficient documentation

## 2020-10-15 ENCOUNTER — Other Ambulatory Visit: Payer: Self-pay | Admitting: Neurology

## 2020-10-15 DIAGNOSIS — N3281 Overactive bladder: Secondary | ICD-10-CM

## 2020-10-18 DIAGNOSIS — E041 Nontoxic single thyroid nodule: Secondary | ICD-10-CM | POA: Insufficient documentation

## 2020-11-09 ENCOUNTER — Other Ambulatory Visit: Payer: Self-pay | Admitting: Neurology

## 2020-11-09 DIAGNOSIS — N3281 Overactive bladder: Secondary | ICD-10-CM

## 2020-11-26 ENCOUNTER — Telehealth: Payer: Self-pay | Admitting: Neurology

## 2020-11-26 MED ORDER — DULOXETINE HCL 60 MG PO CPEP: 60.0000 mg | ORAL_CAPSULE | Freq: Every day | ORAL | 0 refills | Status: DC

## 2020-11-26 NOTE — Telephone Encounter (Signed)
Pt has called for a refill on his DULoxetine (CYMBALTA) 60 MG capsule to [id number]

## 2020-11-26 NOTE — Telephone Encounter (Signed)
E-scribed refill to pharmacy. Pt has f/u this month.

## 2020-12-04 NOTE — Patient Instructions (Addendum)
Below is our plan: ? ?We will continue current treatment plan ? ?Please make sure you are staying well hydrated. I recommend 50-60 ounces daily. Well balanced diet and regular exercise encouraged. Consistent sleep schedule with 6-8 hours recommended.  ? ?Please continue follow up with care team as directed.  ? ?Follow up with Dr Sater in 6 months  ? ?You may receive a survey regarding today's visit. I encourage you to leave honest feed back as I do use this information to improve patient care. Thank you for seeing me today!  ?Management of Memory Problems ?  ?There are some general things you can do to help manage your memory problems.  Your memory may not in fact recover, but by using techniques and strategies you will be able to manage your memory difficulties better. ?  ?1)  Establish a routine. ?Try to establish and then stick to a regular routine.  By doing this, you will get used to what to expect and you will reduce the need to rely on your memory.  Also, try to do things at the same time of day, such as taking your medication or checking your calendar first thing in the morning. ?Think about think that you can do as a part of a regular routine and make a list.  Then enter them into a daily planner to remind you.  This will help you establish a routine. ?  ?2)  Organize your environment. ?Organize your environment so that it is uncluttered.  Decrease visual stimulation.  Place everyday items such as keys or cell phone in the same place every day (ie.  Basket next to front door) ?Use post it notes with a brief message to yourself (ie. Turn off light, lock the door) ?Use labels to indicate where things go (ie. Which cupboards are for food, dishes, etc.) ?Keep a notepad and pen by the telephone to take messages ?  ?3)  Memory Aids ?A diary or journal/notebook/daily planner ?Making a list (shopping list, chore list, to do list that needs to be done) ?Using an alarm as a reminder (kitchen timer or cell phone  alarm) ?Using cell phone to store information (Notes, Calendar, Reminders) ?Calendar/White board placed in a prominent position ?Post-it notes ?  ?In order for memory aids to be useful, you need to have good habits.  It's no good remembering to make a note in your journal if you don't remember to look in it.  Try setting aside a certain time of day to look in journal. ?  ?4)  Improving mood and managing fatigue. ?There may be other factors that contribute to memory difficulties.  Factors, such as anxiety, depression and tiredness can affect memory. ?Regular gentle exercise can help improve your mood and give you more energy. ?Simple relaxation techniques may help relieve symptoms of anxiety ?Try to get back to completing activities or hobbies you enjoyed doing in the past. ?Learn to pace yourself through activities to decrease fatigue. ?Find out about some local support groups where you can share experiences with others. ?Try and achieve 7-8 hours of sleep at night. ? ? ? ?

## 2020-12-04 NOTE — Progress Notes (Signed)
Chief Complaint  Patient presents with   Follow-up    Rm 1, alone. Here for 6 month MS f/u, off DMT. Pt reports doing well. Pt reports MS sx are about the same. Reports they are constant but stable. Pt has had 3 falls. First fall resulted in R arm fracture, 2nd fall refractured the same area and needed repeat surgery. Pt would like refill on her duloxetine.      HISTORY OF PRESENT ILLNESS:  12/06/20 ALL:  Jill Rogers is a 65 y.o. female here today for follow up for RRMS.   She feels symptoms are fairly stable. She reports having three falls over the past 6 months. She broke right arm in same place twice requiring surgical fixation. First fall was trying to step down off curb with her cane, second fall she fell over her walker when dog's leash got tangled and third she fell in her yard. She was sent to physical therapy for gait training and shoulder strengthening. PT released her and reported she was doing well with gait. She continues amantadine 100mg  BID.   Mood is stable. She continues duloxetine 60mg  and Abilify 5mg  daily. She had been more anxious over the summer when her sister was dying with cancer. Sister passed in September. She usually takes clonazepam 0.5mg  daily but sometimes BID. She takes hydroxyzine 25mg  at bedtime for muscle spasms (jerks) of arms and legs. She has not been sleeping as well since her sister died.   She is no longer taking dextrostat. Weight management started her on phentermine and discontinued dextrostat. She is not sure it is helping. She has follow up next month. She continues to have pretty significant brain fog. She is not able to exercise much. She has been trying to do some light yoga. She walks her dog 1-2 times a day.   Oxybutynin 5mg  helps with urinary frequency. She feels that it has really helped a lot. She is no longer having incontinence.    HISTORY (copied from Dr previous note)  Jill Rogers is a 65 y.o. female with relapsing  remitting multiple sclerosis.      Update 03/30/2020: She discontinued Vumerity earlier this year.  Her left clear-cut exacerbation had been 7 or 8 years earlier.  MRIs have been stable.    The MRI 04/18/2019 showed no bew lesions compared to 2016.     She feels her cognitive fog is worse.   She has felt very tired and poorly focused.     She takes dextroamphetamine 10 mg once most days sometimes bid.     She ran out of dexedrine a month ago and has done much worse since.       She feels gait is better with the walker rather than the cane.    She has had no recent falls   She wants to do activities but feels too tired to do so.      She feels mood is better since Abilify was added to duloxetine and lamotrigine.   She feels QOL is poor.  She is on Cymbalta and lamotrigine   She is not seeing psychiatry but sees a counselor behavioral health.         She has had her Covid vaccination and booster .    MS History:   She was diagnosed in 1983 after she presented with right hand weakness and a limp.   She had an MRI consistent with MS and she did not need to have a  LP.    Those symptoms improved after a few weeks.     She was initially placed on Symmetrel for her fatigue and received some steroids (oral or IV) several times.    She was started on Avonex in 1994.   She switched to Rebif 5 years ago after an exacerbation treated with IV solu-medrol.    Also during that time, her husband left and she was under a lot of stress.    She started seeing me around 2013.  Her last exacerbation was 2014 with some gait issues.   She tolerates Rebif well but has had some bruising at times.       She switched to Vumerity in 2020.  Due to side effects and being stable for many years, DMT's were discontinued in 2021.   IMAGING MRI 04/18/2019 showed T2/FLAIR hyperintense foci in the hemispheres and also a few foci in the pons and cerebellum in a pattern and configuration consistent with chronic demyelinating plaque associated  with multiple sclerosis.  None of the foci enhance after contrast.  Compared to the MRI from 2016, there is no definite interim change.  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   MRI of the brain 12/10/2019 showed no new lesions.   MRI of the cervical spine 03/28/2016 showed a normal spinal cord and degenerative changes that could affect the C5 nerve roots at C4-C5 and the left C6 nerve root at C5-C6   REVIEW OF SYSTEMS: Out of a complete 14 system review of symptoms, the patient complains only of the following symptoms, chronic pain, fatigue, anxiety, depression, falls, gait abnormality, insomnia, brain fog, and all other reviewed systems are negative.   ALLERGIES: Allergies  Allergen Reactions   Hydrocodone Nausea And Vomiting   Oxycodone Nausea And Vomiting   Tape     bandaids rip her skin/fim   Vumerity [Diroximel Fumarate]     welts     HOME MEDICATIONS: Outpatient Medications Prior to Visit  Medication Sig Dispense Refill   Amantadine HCl 100 MG tablet Take 1 tablet (100 mg total) by mouth 2 (two) times daily. 180 tablet 3   ARIPiprazole (ABILIFY) 5 MG tablet TAKE 1 TABLET (5 MG TOTAL) BY MOUTH DAILY. 90 tablet 1   atorvastatin (LIPITOR) 10 MG tablet Take 10 mg by mouth daily.  0   b complex vitamins capsule Take 1 capsule by mouth daily.     Camphor-Menthol-Methyl Sal (SALONPAS EX) Apply 1 application topically as needed (for knee).     Cholecalciferol (VITAMIN D3 PO) Take 1,000 Units by mouth in the morning and at bedtime.     clonazePAM (KLONOPIN) 0.5 MG tablet TAKE 1 TABLET BY MOUTH 2 TIMES DAILY AS NEEDED FOR ANXIETY. 60 tablet 1   co-enzyme Q-10 30 MG capsule Take 200 mg by mouth daily.      diphenhydrAMINE (BENADRYL) 25 MG tablet Take 25 mg by mouth as needed for allergies.     ELDERBERRY PO Take 1 Dose by mouth as needed. Takes 1tsp when needed     hydrochlorothiazide (HYDRODIURIL) 25 MG tablet Take 25 mg by mouth  daily.     hydrOXYzine (ATARAX/VISTARIL) 25 MG tablet Take 1 tablet (25 mg total) by mouth 3 (three) times daily as needed. 270 tablet 0   indomethacin (INDOCIN) 25 MG capsule One po tid prn headache 90 capsule 0   KRILL OIL/ASTAXANTHIN 1000 MG CAPS Take 500 mg by mouth 2 (two) times a day.  lamoTRIgine (LAMICTAL) 100 MG tablet Take 1 tablet (100 mg total) by mouth 2 (two) times daily. 180 tablet 1   meloxicam (MOBIC) 7.5 MG tablet TAKE 1 TABLET BY MOUTH EVERY DAY AS NEEDED KNEE PAIN 90 tablet 1   OVER THE COUNTER MEDICATION Take 1 Dose by mouth as needed. Hemp seed/metcha     oxybutynin (DITROPAN) 5 MG tablet TAKE 1 TABLET BY MOUTH TWICE A DAY 180 tablet 1   pantoprazole (PROTONIX) 40 MG tablet Take 40 mg by mouth daily.     phentermine 37.5 MG capsule Take 37.5 mg by mouth every morning.     PROAIR HFA 108 (90 Base) MCG/ACT inhaler      dextroamphetamine (DEXTROSTAT) 10 MG tablet Take up to 3 tablets daily as needed. 90 tablet 0   DULoxetine (CYMBALTA) 60 MG capsule Take 1 capsule (60 mg total) by mouth daily. 90 capsule 0   BREO ELLIPTA 100-25 MCG/INH AEPB Inhale 1 puff into the lungs daily.  0   methylPREDNISolone (MEDROL DOSEPAK) 4 MG TBPK tablet Take 6 tablets on day 1 and decrease by 1 tablet each day until finished 21 tablet 0   No facility-administered medications prior to visit.     PAST MEDICAL HISTORY: Past Medical History:  Diagnosis Date   Movement disorder    Multiple sclerosis (HCC)    Vision abnormalities      PAST SURGICAL HISTORY: Past Surgical History:  Procedure Laterality Date   CERVICAL CONE BIOPSY     CHOLECYSTECTOMY, LAPAROSCOPIC       FAMILY HISTORY: Family History  Problem Relation Age of Onset   Diabetes type II Mother    Arthritis Mother    Heart failure Father    Diabetes type II Father      SOCIAL HISTORY: Social History   Socioeconomic History   Marital status: Divorced    Spouse name: Not on file   Number of children: Not on  file   Years of education: Not on file   Highest education level: Not on file  Occupational History   Not on file  Tobacco Use   Smoking status: Former    Packs/day: 0.50    Types: Cigarettes   Smokeless tobacco: Never  Substance and Sexual Activity   Alcohol use: Yes    Alcohol/week: 0.0 standard drinks    Comment: occasional/fim   Drug use: No   Sexual activity: Not on file  Other Topics Concern   Not on file  Social History Narrative   Not on file   Social Determinants of Health   Financial Resource Strain: Not on file  Food Insecurity: Not on file  Transportation Needs: Not on file  Physical Activity: Not on file  Stress: Not on file  Social Connections: Not on file  Intimate Partner Violence: Not on file     PHYSICAL EXAM  Vitals:   12/06/20 0845  BP: 135/84  Pulse: 85  Weight: 212 lb (96.2 kg)  Height: 5\' 3"  (1.6 m)   Body mass index is 37.55 kg/m.  Generalized: Well developed, in no acute distress  Cardiology: normal rate and rhythm, no murmur auscultated  Respiratory: clear to auscultation bilaterally    Neurological examination  Mentation: Alert oriented to time, place, history taking. Follows all commands speech and language fluent Cranial nerve II-XII: Pupils were equal round reactive to light. Extraocular movements were full, visual field were full on confrontational test. Facial sensation and strength were normal. Uvula tongue midline. Head turning and shoulder  shrug  were normal and symmetric. Motor: The motor testing reveals 5 over 5 strength of all 4 extremities.  Sensory: Sensory testing is intact to soft touch on all 4 extremities. No evidence of extinction is noted.  Coordination: Cerebellar testing reveals good finger-nose-finger and heel-to-shin bilaterally.  Gait and station: Gait is slightly wide Reflexes: Deep tendon reflexes are symmetric and normal bilaterally.    DIAGNOSTIC DATA (LABS, IMAGING, TESTING) - I reviewed patient  records, labs, notes, testing and imaging myself where available.  Lab Results  Component Value Date   WBC 10.4 01/26/2019   HGB 14.5 01/26/2019   HCT 44.8 01/26/2019   MCV 91 01/26/2019   PLT 213 01/26/2019      Component Value Date/Time   NA 141 01/26/2019 1217   K 3.9 01/26/2019 1217   CL 100 01/26/2019 1217   CO2 25 01/26/2019 1217   GLUCOSE 138 (H) 01/26/2019 1217   BUN 16 01/26/2019 1217   CREATININE 0.71 01/26/2019 1217   CALCIUM 9.9 01/26/2019 1217   PROT 6.7 01/26/2019 1217   ALBUMIN 4.8 01/26/2019 1217   AST 15 01/26/2019 1217   ALT 14 01/26/2019 1217   ALKPHOS 80 01/26/2019 1217   BILITOT 0.2 01/26/2019 1217   GFRNONAA 92 01/26/2019 1217   GFRAA 106 01/26/2019 1217   No results found for: CHOL, HDL, LDLCALC, LDLDIRECT, TRIG, CHOLHDL No results found for: UYQI3K No results found for: VITAMINB12 No results found for: TSH  No flowsheet data found.   No flowsheet data found.   ASSESSMENT AND PLAN  65 y.o. year old female  has a past medical history of Movement disorder, Multiple sclerosis (HCC), and Vision abnormalities. here with    Multiple sclerosis (HCC)  Attention deficit disorder, unspecified hyperactivity presence  Dysesthesia  Abnormality of gait  Insomnia, unspecified type  Agitated depression (HCC)  Alyannah is doing fairly well. No new or exacerbating MS symptoms. She will remain off DMT for now. She will continue duloxetine, amantadine, Abilify, clonazepam, hydroxyzine and oxybutynin as directed. She will discontinue dextrostat while on phentermine. Close follow up advised with weight management. Fall precautions advised. She recently completed PT. She will use walker or cane at all times. Healthy lifestyle habits encouraged. Memory compensation strategies reviewed. She will follow up in 6 months.    No orders of the defined types were placed in this encounter.    Meds ordered this encounter  Medications   DULoxetine (CYMBALTA) 60 MG  capsule    Sig: Take 1 capsule (60 mg total) by mouth daily.    Dispense:  90 capsule    Refill:  1   DISCONTD: dextroamphetamine (DEXTROSTAT) 10 MG tablet    Sig: Take up to 3 tablets daily as needed.    Dispense:  90 tablet    Refill:  0    Order Specific Question:   Supervising Provider    Answer:   Anson Fret [[id number]]       Shawnie Dapper, MSN, FNP-C 12/06/2020, 9:54 AM  [facility] [address], [address] [place], [place] [address] [phone]

## 2020-12-06 ENCOUNTER — Ambulatory Visit: Payer: Medicare HMO | Admitting: Family Medicine

## 2020-12-06 ENCOUNTER — Encounter: Payer: Self-pay | Admitting: Family Medicine

## 2020-12-06 VITALS — BP 135/84 | HR 85 | Ht 63.0 in | Wt 212.0 lb

## 2020-12-06 DIAGNOSIS — F988 Other specified behavioral and emotional disorders with onset usually occurring in childhood and adolescence: Secondary | ICD-10-CM | POA: Diagnosis not present

## 2020-12-06 DIAGNOSIS — R208 Other disturbances of skin sensation: Secondary | ICD-10-CM | POA: Diagnosis not present

## 2020-12-06 DIAGNOSIS — R269 Unspecified abnormalities of gait and mobility: Secondary | ICD-10-CM

## 2020-12-06 DIAGNOSIS — G35 Multiple sclerosis: Secondary | ICD-10-CM | POA: Diagnosis not present

## 2020-12-06 DIAGNOSIS — G4700 Insomnia, unspecified: Secondary | ICD-10-CM

## 2020-12-06 DIAGNOSIS — F322 Major depressive disorder, single episode, severe without psychotic features: Secondary | ICD-10-CM

## 2020-12-06 MED ORDER — DEXTROAMPHETAMINE SULFATE 10 MG PO TABS: ORAL_TABLET | ORAL | 0 refills | Status: DC

## 2020-12-06 MED ORDER — DULOXETINE HCL 60 MG PO CPEP
60.0000 mg | ORAL_CAPSULE | Freq: Every day | ORAL | 1 refills | Status: DC
Start: 2020-12-06 — End: 2021-03-28

## 2021-02-14 ENCOUNTER — Telehealth: Payer: Self-pay | Admitting: Family Medicine

## 2021-02-14 NOTE — Telephone Encounter (Signed)
Called the patient and was able to offer a opening on Monday with Dr Epimenio Foot at 2 pm. Pt accepted and will be here for the visit

## 2021-02-14 NOTE — Telephone Encounter (Signed)
Pt called stating that [employer] will cover the Tecfidera so she would like an Rx for this medication to be written and sent to them. Please advise.

## 2021-02-14 NOTE — Telephone Encounter (Signed)
Called the pt back because I don't see where she is currently on any DMT therapy.  Pt states that she had been actively on a wait list with the [employer] foundation for grant for when she was on tecfidera. She stopped receiving the grant and insurance was not covering and so she stopped treatment and was placed on vumerity and then had a reaction to that.  [employer] has contacted the pt to tell her she can not receive funding for Tecfidera because it is on insurance formulary. She is needing a bill submitted to the [employer] within 120 days or she will loose the funding.  Advised that we really would need to bring her in to discuss treatment options and restarting the medication. Due to needing to have this submitted asap I advised I would have to discuss with Dr Epimenio Foot and see his thoughts and whether he will order this or work her in to be seen.

## 2021-02-18 ENCOUNTER — Ambulatory Visit: Payer: Medicare HMO | Admitting: Neurology

## 2021-02-18 ENCOUNTER — Encounter: Payer: Self-pay | Admitting: Neurology

## 2021-02-18 ENCOUNTER — Other Ambulatory Visit: Payer: Self-pay

## 2021-02-18 VITALS — BP 140/79 | HR 89 | Ht 63.0 in | Wt 215.0 lb

## 2021-02-18 DIAGNOSIS — F322 Major depressive disorder, single episode, severe without psychotic features: Secondary | ICD-10-CM

## 2021-02-18 DIAGNOSIS — R269 Unspecified abnormalities of gait and mobility: Secondary | ICD-10-CM

## 2021-02-18 DIAGNOSIS — R208 Other disturbances of skin sensation: Secondary | ICD-10-CM

## 2021-02-18 DIAGNOSIS — Z79899 Other long term (current) drug therapy: Secondary | ICD-10-CM

## 2021-02-18 DIAGNOSIS — F988 Other specified behavioral and emotional disorders with onset usually occurring in childhood and adolescence: Secondary | ICD-10-CM | POA: Diagnosis not present

## 2021-02-18 DIAGNOSIS — G35 Multiple sclerosis: Secondary | ICD-10-CM | POA: Diagnosis not present

## 2021-02-18 MED ORDER — HYDROXYZINE PAMOATE 25 MG PO CAPS: 25.0000 mg | ORAL_CAPSULE | Freq: Three times a day (TID) | ORAL | 1 refills | Status: AC | PRN

## 2021-02-18 MED ORDER — DIMETHYL FUMARATE 120 MG PO CPDR: DELAYED_RELEASE_CAPSULE | ORAL | 0 refills | Status: DC

## 2021-02-18 MED ORDER — DIMETHYL FUMARATE 240 MG PO CPDR: DELAYED_RELEASE_CAPSULE | ORAL | 11 refills | Status: DC

## 2021-02-18 MED ORDER — AMANTADINE HCL 100 MG PO TABS
100.0000 mg | ORAL_TABLET | Freq: Two times a day (BID) | ORAL | 3 refills | Status: DC
Start: 2021-02-18 — End: 2022-06-19

## 2021-02-18 MED ORDER — LAMOTRIGINE 100 MG PO TABS: 100.0000 mg | ORAL_TABLET | Freq: Two times a day (BID) | ORAL | 3 refills | Status: DC

## 2021-02-18 NOTE — Progress Notes (Signed)
[facility]  PATIENT: Jill Rogers DOB: 1955-04-07  REFERRING DOCTOR OR PCP:  None SOURCE: patient  _________________________________   HISTORICAL  CHIEF COMPLAINT:  Chief Complaint  Patient presents with   Follow-up    Rm 2, alone. Here for MS medication management. Pts grant has been approved and wanted to go back on the topamax.     HISTORY OF PRESENT ILLNESS:  Jill Rogers is a 66 y.o. female with relapsing remitting multiple sclerosis.     Update 1/9/20232: She had been on Tecfidera (was getting with a grant through [employer]).  She tolerated it well and her MS was under control.  However, when it became generic she was unable to get it covered.  Generic dimethyl fumarate was going to be too expensive for her.  She tried Vumerity but had a lot of itching and stopped.  She has been able to get funded again through [employer] and now Tecfidera and dimethyl fumarate are both listed.  When she was having difficulty with the medications and based on the fact that she had gone many years without any exacerbation or MRI change, we decided to see how she would do off of disease modifying therapies.  Unfortunately, she failed that gait and cognition worsened when she was not on a disease modifying therapy.  Therefore she would like to get back on it.  Her left clear-cut exacerbation had been 7 or 8 years earlier.  MRIs have been stable.    The brain MRI 04/18/2019 showed no bew lesions compared to 2016.  The MRI of the cervical spine 06/23/2020 showed normal spinal cord.  She did have degenerative changes but nothing that would affect her leg strength.  She feels her cognitive fog is worse.   She has felt very tired and poorly focused.     She was on dextroamphetamine and then had trouble with cost so was placed on phentermine for a while.  She felt the phentermine did not help as much.     She feels gait is about the same this month as last month though she felt she was doing  better a couple years ago..    She has had no recent falls   She wants to do activities but feels too tired to do so.      She feels mood is better since Abilify was added to duloxetine and lamotrigine.    She is on Cymbalta and lamotrigine   She is not seeing psychiatry but sees a counselor behavioral health.          MS History:   She was diagnosed in 1983 after she presented with right hand weakness and a limp.   She had an MRI consistent with MS and she did not need to have a LP.    Those symptoms improved after a few weeks.     She was initially placed on Symmetrel for her fatigue and received some steroids (oral or IV) several times.    She was started on Avonex in 1994.   She switched to Rebif 5 years ago after an exacerbation treated with IV solu-medrol.    Also during that time, her husband left and she was under a lot of stress.    She started seeing me around 2013.  Her last exacerbation was 2014 with some gait issues.   She tolerates Rebif well but has had some bruising at times.       She switched to Vumerity in 2020.  Due to side effects and being stable for many years, DMT's were discontinued in 2021.  IMAGING MRI 04/18/2019 showed T2/FLAIR hyperintense foci in the hemispheres and also a few foci in the pons and cerebellum in a pattern and configuration consistent with chronic demyelinating plaque associated with multiple sclerosis.  None of the foci enhance after contrast.  Compared to the MRI from 2016, there is no definite interim change.  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  MRI of the brain 12/10/2019 showed no new lesions.  MRI of the cervical spine 03/28/2016 showed a normal spinal cord and degenerative changes that could affect the C5 nerve roots at C4-C5 and the left C6 nerve root at C5-C6   REVIEW OF SYSTEMS: Constitutional: No fevers, chills, sweats, or change in appetite.   Fatigue.   She reports excessive  sleepines Eyes: No visual changes.   Notes  double vision.  No eye pain Ear, nose and throat: No hearing loss, ear pain, nasal congestion, sore throat.  Recent ear infection Cardiovascular: No chest pain, palpitations Respiratory:  No shortness of breath at rest or with exertion.   No wheezes GastrointestinaI: No nausea, vomiting, diarrhea, abdominal pain, fecal incontinence Genitourinary: see above Musculoskeletal:  Mild neck pain, back pain.   Muscles stiff Integumentary: No rash, pruritus.   Has boils/abscesses Neurological: as above Psychiatric: see above Endocrine: No palpitations, diaphoresis, change in appetite, change in weigh or increased thirst Hematologic/Lymphatic:  No anemia, petechiae.   Bruises easily at injeciton sites Allergic/Immunologic: No itchy/runny eyes, nasal congestion, recent allergic reactions, rashes  ALLERGIES: Allergies  Allergen Reactions   Hydrocodone Nausea And Vomiting   Oxycodone Nausea And Vomiting   Tape     bandaids rip her skin/fim   Vumerity [Diroximel Fumarate]     welts    HOME MEDICATIONS:  Current Outpatient Medications:    alendronate (FOSAMAX) 70 MG tablet, Take 70 mg by mouth once a week., Disp: , Rfl:    ARIPiprazole (ABILIFY) 5 MG tablet, TAKE 1 TABLET (5 MG TOTAL) BY MOUTH DAILY., Disp: 90 tablet, Rfl: 1   atorvastatin (LIPITOR) 10 MG tablet, Take 10 mg by mouth daily., Disp: , Rfl: 0   b complex vitamins capsule, Take 1 capsule by mouth daily., Disp: , Rfl:    Calcium Carbonate (CALCIUM 600 PO), Take 1 tablet by mouth daily., Disp: , Rfl:    Camphor-Menthol-Methyl Sal (SALONPAS EX), Apply 1 application topically as needed (for knee)., Disp: , Rfl:    Cholecalciferol (VITAMIN D3 PO), Take 1,000 Units by mouth in the morning and at bedtime., Disp: , Rfl:    clonazePAM (KLONOPIN) 0.5 MG tablet, TAKE 1 TABLET BY MOUTH 2 TIMES DAILY AS NEEDED FOR ANXIETY., Disp: 60 tablet, Rfl: 1   co-enzyme Q-10 30 MG capsule, Take 200 mg by mouth  daily. , Disp: , Rfl:    dextroamphetamine (DEXTROSTAT) 10 MG tablet, Take 10 mg by mouth 3 (three) times daily., Disp: , Rfl:    Dimethyl Fumarate 120 MG CPDR, 120 mg po bid x 1 week, Disp: 14 capsule, Rfl: 0   Dimethyl Fumarate 240 MG CPDR, 240 mg po bid.   Begin after taking the 120 mg dose, Disp: 60 capsule, Rfl: 11   diphenhydrAMINE (BENADRYL) 25 MG tablet, Take 25 mg by mouth as needed for allergies., Disp: , Rfl:    DULoxetine (CYMBALTA) 60 MG capsule, Take 1 capsule (60 mg total) by mouth daily., Disp: 90 capsule, Rfl: 1  ELDERBERRY PO, Take 1 Dose by mouth as needed. Takes 1tsp when needed, Disp: , Rfl:    hydrochlorothiazide (HYDRODIURIL) 25 MG tablet, Take 25 mg by mouth daily., Disp: , Rfl:    hydrOXYzine (VISTARIL) 25 MG capsule, Take 1 capsule (25 mg total) by mouth every 8 (eight) hours as needed., Disp: 270 capsule, Rfl: 1   indomethacin (INDOCIN) 25 MG capsule, One po tid prn headache, Disp: 90 capsule, Rfl: 0   KRILL OIL/ASTAXANTHIN 1000 MG CAPS, Take 500 mg by mouth 2 (two) times a day. , Disp: , Rfl:    meloxicam (MOBIC) 7.5 MG tablet, TAKE 1 TABLET BY MOUTH EVERY DAY AS NEEDED KNEE PAIN, Disp: 90 tablet, Rfl: 1   OVER THE COUNTER MEDICATION, Take 1 Dose by mouth as needed. Hemp seed/metcha, Disp: , Rfl:    oxybutynin (DITROPAN) 5 MG tablet, TAKE 1 TABLET BY MOUTH TWICE A DAY, Disp: 180 tablet, Rfl: 1   pantoprazole (PROTONIX) 40 MG tablet, Take 40 mg by mouth daily., Disp: , Rfl:    Amantadine HCl 100 MG tablet, Take 1 tablet (100 mg total) by mouth 2 (two) times daily., Disp: 180 tablet, Rfl: 3   lamoTRIgine (LAMICTAL) 100 MG tablet, Take 1 tablet (100 mg total) by mouth 2 (two) times daily., Disp: 180 tablet, Rfl: 3  PAST MEDICAL HISTORY: Past Medical History:  Diagnosis Date   Movement disorder    Multiple sclerosis (HCC)    Vision abnormalities     PAST SURGICAL HISTORY: Past Surgical History:  Procedure Laterality Date   CERVICAL CONE BIOPSY      CHOLECYSTECTOMY, LAPAROSCOPIC      FAMILY HISTORY: Family History  Problem Relation Age of Onset   Diabetes type II Mother    Arthritis Mother    Heart failure Father    Diabetes type II Father     SOCIAL HISTORY:  Social History   Socioeconomic History   Marital status: Divorced    Spouse name: Not on file   Number of children: Not on file   Years of education: Not on file   Highest education level: Not on file  Occupational History   Not on file  Tobacco Use   Smoking status: Former    Packs/day: 0.50    Types: Cigarettes   Smokeless tobacco: Never  Substance and Sexual Activity   Alcohol use: Yes    Alcohol/week: 0.0 standard drinks    Comment: occasional/fim   Drug use: No   Sexual activity: Not on file  Other Topics Concern   Not on file  Social History Narrative   Not on file   Social Determinants of Health   Financial Resource Strain: Not on file  Food Insecurity: Not on file  Transportation Needs: Not on file  Physical Activity: Not on file  Stress: Not on file  Social Connections: Not on file  Intimate Partner Violence: Not on file     PHYSICAL EXAM  Vitals:   02/18/21 1409  BP: 140/79  Pulse: 89  Weight: 215 lb (97.5 kg)  Height:  (1.6 m)    Body mass index is 38.09 kg/m.   General: The patient is well-developed and well-nourished.  She is not agitated today. Neurologic Exam  Mental status: The patient is alert and oriented x 3 at the time of the examination. The patient has apparent normal recent and remote memory, with an apparently normal attention span and concentration ability.   Speech is normal.  Cranial nerves: Extraocular movements  are full.  Facial strength and sensation was normal.  Trapezius strength was normal.   No obvious hearing deficits are noted.  Motor:  Muscle bulk is normal.   She has mildly increased tone in the legs, R>L.Marland Kitchen Strength is  5 / 5 in arms and legs.  Sensory: She has intact sensation to touch in  the arms and lower legs   Coordination: Cerebellar testing shows good finger-nose-finger.  Gait and station: Station is normal.   Gait is mildly wide and the tandem gait is moderately wide.  Romberg is negative..   Reflexes: Deep tendon reflexes are normal in the arms but mildly  increased in right leg.      Multiple sclerosis (HCC) - Plan: CBC with Differential/Platelet, Comprehensive metabolic panel  High risk medication use - Plan: CBC with Differential/Platelet, Comprehensive metabolic panel  Gait disturbance  Attention deficit disorder, unspecified hyperactivity presence  Dysesthesia  Agitated depression (HCC)  1.   We will get her restarted on Tecfidera or dimethyl fumarate.  Her last MRI was stable.    2.   Mood is doing better on the current medications.  She will continue these.    3.   Stay active and exercise as tolerated 4.   She can go back on Dexedrine 10 mg up to 3 a day for ADD related to her MS. 5.    return in 6 months or sooner if there are new or worsening neurologic symptoms.      Irish Piech A. Epimenio Foot, MD, PhD 02/18/2021, 3:40 PM Certified in Neurology, Clinical Neurophysiology, Sleep Medicine, Pain Medicine and Neuroimaging  [facility] [address] [place], [place] [address] [phone]

## 2021-02-19 LAB — CBC WITH DIFFERENTIAL/PLATELET
Basophils Absolute: 0.1 10*3/uL (ref 0.0–0.2)
Basos: 1 %
EOS (ABSOLUTE): 0.1 10*3/uL (ref 0.0–0.4)
Eos: 1 %
Hematocrit: 41.7 % (ref 34.0–46.6)
Hemoglobin: 13.3 g/dL (ref 11.1–15.9)
Immature Grans (Abs): 0 10*3/uL (ref 0.0–0.1)
Immature Granulocytes: 0 %
Lymphocytes Absolute: 2.7 10*3/uL (ref 0.7–3.1)
Lymphs: 27 %
MCH: 27.1 pg (ref 26.6–33.0)
MCHC: 31.9 g/dL (ref 31.5–35.7)
MCV: 85 fL (ref 79–97)
Monocytes Absolute: 0.7 10*3/uL (ref 0.1–0.9)
Monocytes: 7 %
Neutrophils Absolute: 6.4 10*3/uL (ref 1.4–7.0)
Neutrophils: 64 %
Platelets: 237 10*3/uL (ref 150–450)
RBC: 4.9 x10E6/uL (ref 3.77–5.28)
RDW: 15.3 % (ref 11.7–15.4)
WBC: 10 10*3/uL (ref 3.4–10.8)

## 2021-02-19 LAB — COMPREHENSIVE METABOLIC PANEL
ALT: 21 IU/L (ref 0–32)
AST: 20 IU/L (ref 0–40)
Albumin/Globulin Ratio: 2 (ref 1.2–2.2)
Albumin: 4.5 g/dL (ref 3.8–4.8)
Alkaline Phosphatase: 90 IU/L (ref 44–121)
BUN/Creatinine Ratio: 18 (ref 12–28)
BUN: 15 mg/dL (ref 8–27)
Bilirubin Total: <0.2 mg/dL (ref 0.0–1.2)
CO2: 24 mmol/L (ref 20–29)
Calcium: 9.8 mg/dL (ref 8.7–10.3)
Chloride: 103 mmol/L (ref 96–106)
Creatinine, Ser: 0.85 mg/dL (ref 0.57–1.00)
Globulin, Total: 2.3 g/dL (ref 1.5–4.5)
Glucose: 86 mg/dL (ref 70–99)
Potassium: 4.3 mmol/L (ref 3.5–5.2)
Sodium: 140 mmol/L (ref 134–144)
Total Protein: 6.8 g/dL (ref 6.0–8.5)
eGFR: 77 mL/min/{1.73_m2} (ref >59)

## 2021-02-26 ENCOUNTER — Other Ambulatory Visit: Payer: Self-pay | Admitting: Neurology

## 2021-02-28 ENCOUNTER — Telehealth: Payer: Self-pay | Admitting: Neurology

## 2021-02-28 NOTE — Telephone Encounter (Signed)
Called pt back. Advised she can print/sign and upload to email back to [place]. She does not have printer. She cannot come by office to sign today or or tomorrow. She will come back Monday to sign Tecfidera start form. I placed up front for her in file for front staff to pull and have her sign.

## 2021-02-28 NOTE — Telephone Encounter (Signed)
Called pt back. Advised I initiated PA case on CMM. Key:  [id number] - PA Case ID: [id number]. However, it came back stating name brand, Tecfidera preferred. She will need to sign start form for this. Confirmed email on file and aware I will be emailing to her. She should sign and then send back to [place] to process for her. She verbalized understanding.

## 2021-02-28 NOTE — Telephone Encounter (Signed)
Pt has called back to inform she is unable to e-sign she is asking for a call as to how else she can get this resolved.

## 2021-02-28 NOTE — Telephone Encounter (Signed)
Pt said [facility] need PA sent for Dimethyl Fumarate 120 MG CPDR Dimethyl Fumarate 240 MG CPDR

## 2021-03-04 NOTE — Telephone Encounter (Signed)
Initiated Tecfidera PA on CMM. Key: [id number]. Sent to [facility]. Should have a determination within 1-3 business days.

## 2021-03-04 NOTE — Telephone Encounter (Signed)
Received Tecfidera start form from patient. Given to Dr. Epimenio Foot for review and signature.

## 2021-03-04 NOTE — Telephone Encounter (Signed)
[facility] has approved Tecfidera from 02/10/2021-02/09/2022.

## 2021-03-04 NOTE — Telephone Encounter (Signed)
Tecfidera start form faxed to [employer]. Received a receipt of confirmation.  

## 2021-03-07 ENCOUNTER — Telehealth: Payer: Self-pay | Admitting: Neurology

## 2021-03-07 NOTE — Telephone Encounter (Signed)
Pt called and LVM stating that she is going to have to discontinue taking the ARIPiprazole (ABILIFY) 5 MG tablet due to cost and she is wanting to know how she can wean off of the medication. Pt would also like to know if there is an alternative medication she can take. Please advise.

## 2021-03-11 NOTE — Telephone Encounter (Signed)
Called pt back. She actually ran out of Abilify and has been offx1wk now. Doing ok. Would like to hold off on starting another medication. Wants to see how things go. She will call back if she would like to start Zyprexa in the future. Nothing further needed.

## 2021-03-11 NOTE — Telephone Encounter (Signed)
Did you want her to wean off of Abilify first before starting Zyprexa?

## 2021-03-19 NOTE — Telephone Encounter (Signed)
I called patient. Her Tecfidera shipment should arrived on 03/25/21. I will check with her next week to ensure she received it and tolerates it well. Pt verbalized understanding.

## 2021-03-28 ENCOUNTER — Other Ambulatory Visit: Payer: Self-pay | Admitting: Neurology

## 2021-04-01 NOTE — Telephone Encounter (Signed)
I called patient. She received her Tecfidera and tolerated the starter dosing well. She started the 240 mg BID today. She had mild itching with her initial 120mg  dose but it has resolved. She will keep her appointment as scheduled for her July and call us with interim questions or concerns.

## 2021-04-02 ENCOUNTER — Other Ambulatory Visit: Payer: Self-pay | Admitting: Neurology

## 2021-04-02 DIAGNOSIS — B372 Candidiasis of skin and nail: Secondary | ICD-10-CM | POA: Insufficient documentation

## 2021-04-02 DIAGNOSIS — Z1509 Genetic susceptibility to other malignant neoplasm: Secondary | ICD-10-CM | POA: Insufficient documentation

## 2021-04-02 DIAGNOSIS — N3281 Overactive bladder: Secondary | ICD-10-CM

## 2021-04-02 DIAGNOSIS — N901 Moderate vulvar dysplasia: Secondary | ICD-10-CM | POA: Insufficient documentation

## 2021-04-03 DIAGNOSIS — R7303 Prediabetes: Secondary | ICD-10-CM | POA: Insufficient documentation

## 2021-04-04 ENCOUNTER — Other Ambulatory Visit: Payer: Self-pay | Admitting: Neurology

## 2021-04-30 ENCOUNTER — Other Ambulatory Visit: Payer: Self-pay | Admitting: Neurology

## 2021-04-30 MED ORDER — DEXTROAMPHETAMINE SULFATE 10 MG PO TABS: 10.0000 mg | ORAL_TABLET | Freq: Three times a day (TID) | ORAL | 0 refills | Status: DC

## 2021-04-30 NOTE — Telephone Encounter (Signed)
Pt last received the medication 12/06/2020 per [place] drug registry. ?I have routed this request to Dr Felecia Shelling for review. The pt is due for the medication and [place] registry was verified. ?  ?

## 2021-04-30 NOTE — Telephone Encounter (Signed)
Pt request refill for dextroamphetamine (DEXTROSTAT) 10 MG tablet at [facility] ?

## 2021-06-06 ENCOUNTER — Ambulatory Visit: Payer: Medicare HMO | Admitting: Neurology

## 2021-06-28 ENCOUNTER — Other Ambulatory Visit: Payer: Self-pay | Admitting: Neurology

## 2021-07-10 ENCOUNTER — Telehealth: Payer: Self-pay | Admitting: Neurology

## 2021-07-10 MED ORDER — DULOXETINE HCL 60 MG PO CPEP: ORAL_CAPSULE | ORAL | 1 refills | Status: DC

## 2021-07-10 NOTE — Telephone Encounter (Signed)
E-scribed refill 

## 2021-07-10 NOTE — Telephone Encounter (Signed)
Pt is requesting a refill for DULoxetine (CYMBALTA) 60 MG capsule .  Pharmacy:  [facility]  [phone] Fax#[phone]

## 2021-07-19 ENCOUNTER — Other Ambulatory Visit: Payer: Self-pay | Admitting: Neurology

## 2021-07-19 NOTE — Telephone Encounter (Signed)
Jill Rogers is asking for a new script for Jill Rogers dextroamphetamine (DEXTROSTAT) 10 MG tablet, 270 tablets 90 day

## 2021-07-22 MED ORDER — DEXTROAMPHETAMINE SULFATE 10 MG PO TABS: 10.0000 mg | ORAL_TABLET | Freq: Three times a day (TID) | ORAL | 0 refills | Status: DC

## 2021-07-22 NOTE — Telephone Encounter (Signed)
Dr. Epimenio Foot- are you ok with calling this in for 90 days supply? If so, please e-scribe Checked drug registry. Last refilled 05/01/21 #90. Last seen 02/18/21 and next f/u 08/19/21.

## 2021-08-05 ENCOUNTER — Other Ambulatory Visit: Payer: Self-pay | Admitting: Neurology

## 2021-08-19 ENCOUNTER — Ambulatory Visit: Payer: Medicare HMO | Admitting: Family Medicine

## 2021-08-19 ENCOUNTER — Encounter: Payer: Self-pay | Admitting: Family Medicine

## 2021-08-19 NOTE — Progress Notes (Deleted)
No chief complaint on file.    HISTORY OF PRESENT ILLNESS:  08/19/21 ALL:  Jill Rogers is a 66 y.o. female here today for follow up for RRMS.   She feels symptoms are fairly stable. She reports having three falls over the past 6 months. She broke right arm in same place twice requiring surgical fixation. First fall was trying to step down off curb with her cane, second fall she fell over her walker when dog's leash got tangled and third she fell in her yard. She was sent to physical therapy for gait training and shoulder strengthening. PT released her and reported she was doing well with gait. She continues amantadine 100mg  BID.   Mood is stable. She continues duloxetine 60mg  and Abilify 5mg  daily. She had been more anxious over the summer when her sister was dying with cancer. Sister passed in September. She usually takes clonazepam 0.5mg  daily but sometimes BID. She takes hydroxyzine 25mg  at bedtime for muscle spasms (jerks) of arms and legs. She has not been sleeping as well since her sister died.   She is no longer taking dextrostat. Weight management started her on phentermine and discontinued dextrostat. She is not sure it is helping. She has follow up next month. She continues to have pretty significant brain fog. She is not able to exercise much. She has been trying to do some light yoga. She walks her dog 1-2 times a day.   Oxybutynin 5mg  helps with urinary frequency. She feels that it has really helped a lot. She is no longer having incontinence.    HISTORY (copied from Dr previous note)  Jill Rogers is a 66 y.o. female with relapsing remitting multiple sclerosis.      Update 1/9/20232: She had been on Tecfidera (was getting with a grant through [employer]).  She tolerated it well and her MS was under control.  However, when it became generic she was unable to get it covered.  Generic dimethyl fumarate was going to be too expensive for her.  She tried Vumerity but had a  lot of itching and stopped.  She has been able to get funded again through [employer] and now Tecfidera and dimethyl fumarate are both listed.   When she was having difficulty with the medications and based on the fact that she had gone many years without any exacerbation or MRI change, we decided to see how she would do off of disease modifying therapies.  Unfortunately, she failed that gait and cognition worsened when she was not on a disease modifying therapy.  Therefore she would like to get back on it.  Her left clear-cut exacerbation had been 7 or 8 years earlier.  MRIs have been stable.    The brain MRI 04/18/2019 showed no bew lesions compared to 2016.  The MRI of the cervical spine 06/23/2020 showed normal spinal cord.  She did have degenerative changes but nothing that would affect her leg strength.   She feels her cognitive fog is worse.   She has felt very tired and poorly focused.     She was on dextroamphetamine and then had trouble with cost so was placed on phentermine for a while.  She felt the phentermine did not help as much.      She feels gait is about the same this month as last month though she felt she was doing better a couple years ago..    She has had no recent falls   She wants to  do activities but feels too tired to do so.      She feels mood is better since Abilify was added to duloxetine and lamotrigine.    She is on Cymbalta and lamotrigine   She is not seeing psychiatry but sees a counselor behavioral health.           MS History:   She was diagnosed in 1983 after she presented with right hand weakness and a limp.   She had an MRI consistent with MS and she did not need to have a LP.    Those symptoms improved after a few weeks.     She was initially placed on Symmetrel for her fatigue and received some steroids (oral or IV) several times.    She was started on Avonex in 1994.   She switched to Rebif 5 years ago after an exacerbation treated with IV solu-medrol.    Also during that  time, her husband left and she was under a lot of stress.    She started seeing me around 2013.  Her last exacerbation was 2014 with some gait issues.   She tolerates Rebif well but has had some bruising at times.       She switched to Vumerity in 2020.  Due to side effects and being stable for many years, DMT's were discontinued in 2021.   IMAGING MRI 04/18/2019 showed T2/FLAIR hyperintense foci in the hemispheres and also a few foci in the pons and cerebellum in a pattern and configuration consistent with chronic demyelinating plaque associated with multiple sclerosis.  None of the foci enhance after contrast.  Compared to the MRI from 2016, there is no definite interim change.  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   MRI of the brain 12/10/2019 showed no new lesions.   MRI of the cervical spine 03/28/2016 showed a normal spinal cord and degenerative changes that could affect the C5 nerve roots at C4-C5 and the left C6 nerve root at C5-C6   REVIEW OF SYSTEMS: Out of a complete 14 system review of symptoms, the patient complains only of the following symptoms, chronic pain, fatigue, anxiety, depression, falls, gait abnormality, insomnia, brain fog, and all other reviewed systems are negative.   ALLERGIES: Allergies  Allergen Reactions   Hydrocodone Nausea And Vomiting   Oxycodone Nausea And Vomiting   Tape     bandaids rip her skin/fim   Vumerity [Diroximel Fumarate]     welts     HOME MEDICATIONS: Outpatient Medications Prior to Visit  Medication Sig Dispense Refill   alendronate (FOSAMAX) 70 MG tablet Take 70 mg by mouth once a week.     Amantadine HCl 100 MG tablet Take 1 tablet (100 mg total) by mouth 2 (two) times daily. 180 tablet 3   atorvastatin (LIPITOR) 10 MG tablet Take 10 mg by mouth daily.  0   b complex vitamins capsule Take 1 capsule by mouth daily.     Calcium Carbonate (CALCIUM 600 PO) Take 1 tablet by mouth daily.      Camphor-Menthol-Methyl Sal (SALONPAS EX) Apply 1 application topically as needed (for knee).     Cholecalciferol (VITAMIN D3 PO) Take 1,000 Units by mouth in the morning and at bedtime.     clonazePAM (KLONOPIN) 0.5 MG tablet TAKE 1 TABLET BY MOUTH 2 TIMES DAILY AS NEEDED FOR ANXIETY. 60 tablet 1   co-enzyme Q-10 30 MG capsule Take 200 mg by mouth daily.  dextroamphetamine (DEXTROSTAT) 10 MG tablet Take 1 tablet (10 mg total) by mouth 3 (three) times daily. 270 tablet 0   diphenhydrAMINE (BENADRYL) 25 MG tablet Take 25 mg by mouth as needed for allergies.     DULoxetine (CYMBALTA) 60 MG capsule TAKE 1 CAPSULE BY MOUTH EVERY DAY 90 capsule 1   ELDERBERRY PO Take 1 Dose by mouth as needed. Takes 1tsp when needed     hydrochlorothiazide (HYDRODIURIL) 25 MG tablet Take 25 mg by mouth daily.     hydrOXYzine (VISTARIL) 25 MG capsule Take 1 capsule (25 mg total) by mouth every 8 (eight) hours as needed. 270 capsule 1   indomethacin (INDOCIN) 25 MG capsule One po tid prn headache 90 capsule 0   KRILL OIL/ASTAXANTHIN 1000 MG CAPS Take 500 mg by mouth 2 (two) times a day.      lamoTRIgine (LAMICTAL) 100 MG tablet Take 1 tablet (100 mg total) by mouth 2 (two) times daily. 180 tablet 3   meloxicam (MOBIC) 7.5 MG tablet TAKE 1 TABLET BY MOUTH EVERY DAY AS NEEDED KNEE PAIN 90 tablet 1   OVER THE COUNTER MEDICATION Take 1 Dose by mouth as needed. Hemp seed/metcha     oxybutynin (DITROPAN) 5 MG tablet TAKE 1 TABLET BY MOUTH TWICE A DAY 180 tablet 1   pantoprazole (PROTONIX) 40 MG tablet Take 40 mg by mouth daily.     TECFIDERA 240 MG CPDR Take 240 mg by mouth 2 (two) times daily.     No facility-administered medications prior to visit.     PAST MEDICAL HISTORY: Past Medical History:  Diagnosis Date   Movement disorder    Multiple sclerosis (HCC)    Vision abnormalities      PAST SURGICAL HISTORY: Past Surgical History:  Procedure Laterality Date   CERVICAL CONE BIOPSY     CHOLECYSTECTOMY,  LAPAROSCOPIC       FAMILY HISTORY: Family History  Problem Relation Age of Onset   Diabetes type II Mother    Arthritis Mother    Heart failure Father    Diabetes type II Father      SOCIAL HISTORY: Social History   Socioeconomic History   Marital status: Divorced    Spouse name: Not on file   Number of children: Not on file   Years of education: Not on file   Highest education level: Not on file  Occupational History   Not on file  Tobacco Use   Smoking status: Former    Packs/day: 0.50    Types: Cigarettes   Smokeless tobacco: Never  Substance and Sexual Activity   Alcohol use: Yes    Alcohol/week: 0.0 standard drinks of alcohol    Comment: occasional/fim   Drug use: No   Sexual activity: Not on file  Other Topics Concern   Not on file  Social History Narrative   Not on file   Social Determinants of Health   Financial Resource Strain: Not on file  Food Insecurity: Not on file  Transportation Needs: Not on file  Physical Activity: Not on file  Stress: Not on file  Social Connections: Not on file  Intimate Partner Violence: Not on file     PHYSICAL EXAM  There were no vitals filed for this visit.  There is no height or weight on file to calculate BMI.  Generalized: Well developed, in no acute distress  Cardiology: normal rate and rhythm, no murmur auscultated  Respiratory: clear to auscultation bilaterally    Neurological examination  Mentation: Alert oriented to time, place, history taking. Follows all commands speech and language fluent Cranial nerve II-XII: Pupils were equal round reactive to light. Extraocular movements were full, visual field were full on confrontational test. Facial sensation and strength were normal. Uvula tongue midline. Head turning and shoulder shrug  were normal and symmetric. Motor: The motor testing reveals 5 over 5 strength of all 4 extremities.  Sensory: Sensory testing is intact to soft touch on all 4 extremities. No  evidence of extinction is noted.  Coordination: Cerebellar testing reveals good finger-nose-finger and heel-to-shin bilaterally.  Gait and station: Gait is slightly wide Reflexes: Deep tendon reflexes are symmetric and normal bilaterally.    DIAGNOSTIC DATA (LABS, IMAGING, TESTING) - I reviewed patient records, labs, notes, testing and imaging myself where available.  Lab Results  Component Value Date   WBC 10.0 02/18/2021   HGB 13.3 02/18/2021   HCT 41.7 02/18/2021   MCV 85 02/18/2021   PLT 237 02/18/2021      Component Value Date/Time   NA 140 02/18/2021 1459   K 4.3 02/18/2021 1459   CL 103 02/18/2021 1459   CO2 24 02/18/2021 1459   GLUCOSE 86 02/18/2021 1459   BUN 15 02/18/2021 1459   CREATININE 0.85 02/18/2021 1459   CALCIUM 9.8 02/18/2021 1459   PROT 6.8 02/18/2021 1459   ALBUMIN 4.5 02/18/2021 1459   AST 20 02/18/2021 1459   ALT 21 02/18/2021 1459   ALKPHOS 90 02/18/2021 1459   BILITOT <0.2 02/18/2021 1459   GFRNONAA 92 01/26/2019 1217   GFRAA 106 01/26/2019 1217   No results found for: "CHOL", "HDL", "LDLCALC", "LDLDIRECT", "TRIG", "CHOLHDL" No results found for: "HGBA1C" No results found for: "VITAMINB12" No results found for: "TSH"      No data to display               No data to display           ASSESSMENT AND PLAN  66 y.o. year old female  has a past medical history of Movement disorder, Multiple sclerosis (HCC), and Vision abnormalities. here with    No diagnosis found.  Jill Rogers is doing fairly well. No new or exacerbating MS symptoms. She will remain off DMT for now. She will continue duloxetine, amantadine, Abilify, clonazepam, hydroxyzine and oxybutynin as directed. She will discontinue dextrostat while on phentermine. Close follow up advised with weight management. Fall precautions advised. She recently completed PT. She will use walker or cane at all times. Healthy lifestyle habits encouraged. Memory compensation strategies reviewed.  She will follow up in 6 months.    No orders of the defined types were placed in this encounter.     No orders of the defined types were placed in this encounter.      Shawnie Dapper, MSN, FNP-C 08/19/2021, 12:31 PM  [facility] [address], [address] [place], [place] [address] [phone]

## 2021-09-26 ENCOUNTER — Other Ambulatory Visit: Payer: Self-pay | Admitting: Neurology

## 2021-11-24 ENCOUNTER — Other Ambulatory Visit: Payer: Self-pay | Admitting: Neurology

## 2021-11-24 DIAGNOSIS — N3281 Overactive bladder: Secondary | ICD-10-CM

## 2021-12-17 DIAGNOSIS — H608X3 Other otitis externa, bilateral: Secondary | ICD-10-CM | POA: Insufficient documentation

## 2022-02-15 ENCOUNTER — Other Ambulatory Visit: Payer: Self-pay | Admitting: Neurology

## 2022-02-17 NOTE — Telephone Encounter (Signed)
Patent scheduled on 02/19/22 with Amy Lomax. Rx sent for 90 day supply.

## 2022-02-19 ENCOUNTER — Encounter: Payer: Self-pay | Admitting: Family Medicine

## 2022-02-19 ENCOUNTER — Ambulatory Visit: Payer: Medicare Other | Admitting: Family Medicine

## 2022-02-19 ENCOUNTER — Telehealth: Payer: Self-pay | Admitting: Family Medicine

## 2022-02-19 VITALS — BP 121/70 | HR 85 | Ht 63.0 in | Wt 241.0 lb

## 2022-02-19 DIAGNOSIS — G35 Multiple sclerosis: Secondary | ICD-10-CM

## 2022-02-19 DIAGNOSIS — Z79899 Other long term (current) drug therapy: Secondary | ICD-10-CM

## 2022-02-19 DIAGNOSIS — R208 Other disturbances of skin sensation: Secondary | ICD-10-CM

## 2022-02-19 DIAGNOSIS — F322 Major depressive disorder, single episode, severe without psychotic features: Secondary | ICD-10-CM

## 2022-02-19 DIAGNOSIS — F988 Other specified behavioral and emotional disorders with onset usually occurring in childhood and adolescence: Secondary | ICD-10-CM

## 2022-02-19 DIAGNOSIS — N3281 Overactive bladder: Secondary | ICD-10-CM

## 2022-02-19 DIAGNOSIS — R269 Unspecified abnormalities of gait and mobility: Secondary | ICD-10-CM

## 2022-02-19 MED ORDER — LAMOTRIGINE 100 MG PO TABS: 100.0000 mg | ORAL_TABLET | Freq: Two times a day (BID) | ORAL | 1 refills | Status: DC

## 2022-02-19 MED ORDER — DULOXETINE HCL 60 MG PO CPEP: ORAL_CAPSULE | ORAL | 1 refills | Status: DC

## 2022-02-19 MED ORDER — OXYBUTYNIN CHLORIDE 5 MG PO TABS
5.0000 mg | ORAL_TABLET | Freq: Two times a day (BID) | ORAL | 1 refills | Status: DC
Start: 2022-02-19 — End: 2022-08-19

## 2022-02-19 NOTE — Telephone Encounter (Signed)
[employer] medicare Josem Kaufmann: [phone] exp. 02/19/22-03/20/22 sent to [facility] [phone]

## 2022-02-19 NOTE — Progress Notes (Signed)
Chief Complaint  Patient presents with   Follow-up    Room 2, MS, reports she last fell in the fall at her dermatology visit.      HISTORY OF PRESENT ILLNESS:  02/19/22 ALL:  Jill Rogers is a 67 y.o. female here today for follow up for RRMS. She was last sene by Dr Epimenio Foot 02/2021 and restarted on Tecfidera. MR brain stable 13-Dec-2019, cervical showed degenerative disease progression but no MS lesion in 06/2020.   She reports it has been a bad year for her. She reports not feeling well over the past year. No specific weakness or numbness. She reports multiple falls. Once fell on chest with bruising to chest. She was not seen by provider. She had a fall several months ago at her dermatologist appt. She continues falls to either low lighting or feeling that her legs freeze up. Some falls she had cane and others she didn't. She continues amantadine 100mg  BID.   Meloxicam 7.5mg  QD PRN for knee pain. She may take this once a week. It does help with knee, hip and neck pain. Lamotrigine helps with dysesthesias.   She reports headaches are well managed. She has not needed indomethacin in a while.   Mood is stable but she does not more depression. She lost her sister and feels her family has been divided since Covid. She continues duloxetine 60mg  daily. She usually takes clonazepam 0.5mg  PRN. She tries not to take this regularly. She takes hydroxyzine 25mg  at bedtime for muscle spasms (jerks) of arms and legs. She reports sleep is fairly stable. She does not sleep as well since her sister died in 2020-12-12.   She was restarted on dextrostat at last visit. Last filled for 90 days 07/23/2021. She reports taking 1 tablet in the mornings. She continues to have pretty significant brain fog. She is not able to exercise much. She walks her dog 1-2 times a day.   Oxybutynin 5mg  helps with urinary frequency. She feels that it has really helped a lot. She is no longer having incontinence.    HISTORY (copied from  Dr previous note)  Jill Rogers is a 67 y.o. female with relapsing remitting multiple sclerosis.      Update 1/9/20232: She had been on Tecfidera (was getting with a grant through [employer]).  She tolerated it well and her MS was under control.  However, when it became generic she was unable to get it covered.  Generic dimethyl fumarate was going to be too expensive for her.  She tried Vumerity but had a lot of itching and stopped.  She has been able to get funded again through [employer] and now Tecfidera and dimethyl fumarate are both listed.   When she was having difficulty with the medications and based on the fact that she had gone many years without any exacerbation or MRI change, we decided to see how she would do off of disease modifying therapies.  Unfortunately, she failed that gait and cognition worsened when she was not on a disease modifying therapy.  Therefore she would like to get back on it.  Her left clear-cut exacerbation had been 7 or 8 years earlier.  MRIs have been stable.    The brain MRI 04/18/2019 showed no bew lesions compared to 2016.  The MRI of the cervical spine 06/23/2020 showed normal spinal cord.  She did have degenerative changes but nothing that would affect her leg strength.   She feels her cognitive fog is worse.  She has felt very tired and poorly focused.     She was on dextroamphetamine and then had trouble with cost so was placed on phentermine for a while.  She felt the phentermine did not help as much.      She feels gait is about the same this month as last month though she felt she was doing better a couple years ago..    She has had no recent falls   She wants to do activities but feels too tired to do so.      She feels mood is better since Abilify was added to duloxetine and lamotrigine.    She is on Cymbalta and lamotrigine   She is not seeing psychiatry but sees a counselor behavioral health.         MS History:   She was diagnosed in 1983 after she  presented with right hand weakness and a limp.   She had an MRI consistent with MS and she did not need to have a LP.    Those symptoms improved after a few weeks.     She was initially placed on Symmetrel for her fatigue and received some steroids (oral or IV) several times.    She was started on Avonex in 1994.   She switched to Rebif 5 years ago after an exacerbation treated with IV solu-medrol.    Also during that time, her husband left and she was under a lot of stress.    She started seeing me around 2013.  Her last exacerbation was 2014 with some gait issues.   She tolerates Rebif well but has had some bruising at times.       She switched to Vumerity in 2020.  Due to side effects and being stable for many years, DMT's were discontinued in 2021.   IMAGING MRI 04/18/2019 showed T2/FLAIR hyperintense foci in the hemispheres and also a few foci in the pons and cerebellum in a pattern and configuration consistent with chronic demyelinating plaque associated with multiple sclerosis.  None of the foci enhance after contrast.  Compared to the MRI from 2016, there is no definite interim change.  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   MRI of the brain 12/10/2019 showed no new lesions.   MRI of the cervical spine 03/28/2016 showed a normal spinal cord and degenerative changes that could affect the C5 nerve roots at C4-C5 and the left C6 nerve root at C5-C6  REVIEW OF SYSTEMS: Out of a complete 14 system review of symptoms, the patient complains only of the following symptoms, chronic pain, fatigue, anxiety, depression, falls, gait abnormality, insomnia, brain fog, and all other reviewed systems are negative.   ALLERGIES: Allergies  Allergen Reactions   Hydrocodone Nausea And Vomiting   Oxycodone Nausea And Vomiting   Tape     bandaids rip her skin/fim   Vumerity [Diroximel Fumarate]     welts     HOME MEDICATIONS: Outpatient Medications Prior  to Visit  Medication Sig Dispense Refill   alendronate (FOSAMAX) 70 MG tablet Take 70 mg by mouth once a week.     Amantadine HCl 100 MG tablet Take 1 tablet (100 mg total) by mouth 2 (two) times daily. 180 tablet 3   atorvastatin (LIPITOR) 10 MG tablet Take 10 mg by mouth daily.  0   b complex vitamins capsule Take 1 capsule by mouth daily.     Calcium Carbonate (CALCIUM 600 PO) Take  1 tablet by mouth daily.     Camphor-Menthol-Methyl Sal (SALONPAS EX) Apply 1 application topically as needed (for knee).     Cholecalciferol (VITAMIN D3 PO) Take 1,000 Units by mouth in the morning and at bedtime.     clonazePAM (KLONOPIN) 0.5 MG tablet TAKE 1 TABLET BY MOUTH 2 TIMES DAILY AS NEEDED FOR ANXIETY. 60 tablet 1   co-enzyme Q-10 30 MG capsule Take 200 mg by mouth daily.      dextroamphetamine (DEXTROSTAT) 10 MG tablet Take 1 tablet (10 mg total) by mouth 3 (three) times daily. 270 tablet 0   diphenhydrAMINE (BENADRYL) 25 MG tablet Take 25 mg by mouth as needed for allergies.     ELDERBERRY PO Take 1 Dose by mouth as needed. Takes 1tsp when needed     hydrochlorothiazide (HYDRODIURIL) 25 MG tablet Take 25 mg by mouth daily.     hydrOXYzine (VISTARIL) 25 MG capsule Take 1 capsule (25 mg total) by mouth every 8 (eight) hours as needed. 270 capsule 1   indomethacin (INDOCIN) 25 MG capsule One po tid prn headache 90 capsule 0   KRILL OIL/ASTAXANTHIN 1000 MG CAPS Take 500 mg by mouth 2 (two) times a day.      meloxicam (MOBIC) 7.5 MG tablet TAKE 1 TABLET BY MOUTH EVERY DAY AS NEEDED KNEE PAIN 90 tablet 1   OVER THE COUNTER MEDICATION Take 1 Dose by mouth as needed. Hemp seed/metcha     pantoprazole (PROTONIX) 40 MG tablet Take 40 mg by mouth daily.     TECFIDERA 240 MG CPDR Take 240 mg by mouth 2 (two) times daily.     DULoxetine (CYMBALTA) 60 MG capsule TAKE 1 CAPSULE BY MOUTH EVERY DAY 90 capsule 1   lamoTRIgine (LAMICTAL) 100 MG tablet TAKE 1 TABLET TWICE A DAY 180 tablet 0   oxybutynin (DITROPAN) 5 MG  tablet TAKE 1 TABLET BY MOUTH TWICE A DAY 180 tablet 1   No facility-administered medications prior to visit.     PAST MEDICAL HISTORY: Past Medical History:  Diagnosis Date   Movement disorder    Multiple sclerosis ([place])    Vision abnormalities      PAST SURGICAL HISTORY: Past Surgical History:  Procedure Laterality Date   arm surgery Right    fall 2023   CERVICAL CONE BIOPSY     CHOLECYSTECTOMY, LAPAROSCOPIC       FAMILY HISTORY: Family History  Problem Relation Age of Onset   Diabetes type II Mother    Arthritis Mother    Heart failure Father    Diabetes type II Father      SOCIAL HISTORY: Social History   Socioeconomic History   Marital status: Divorced    Spouse name: Not on file   Number of children: Not on file   Years of education: Not on file   Highest education level: Not on file  Occupational History   Not on file  Tobacco Use   Smoking status: Former    Packs/day: 0.50    Types: Cigarettes   Smokeless tobacco: Never  Substance and Sexual Activity   Alcohol use: Yes    Alcohol/week: 0.0 standard drinks of alcohol    Comment: occasional/fim   Drug use: No   Sexual activity: Not on file  Other Topics Concern   Not on file  Social History Narrative   Not on file   Social Determinants of Health   Financial Resource Strain: Not on file  Food Insecurity: Not on file  Transportation Needs: Not on file  Physical Activity: Not on file  Stress: Not on file  Social Connections: Not on file  Intimate Partner Violence: Not on file     PHYSICAL EXAM  Vitals:   02/19/22 1329  BP: 121/70  Pulse: 85  Weight: 241 lb (109.3 kg)  Height: 5\' 3"  (1.6 m)    Body mass index is 42.69 kg/m.  Generalized: Well developed, in no acute distress  Cardiology: normal rate and rhythm, no murmur auscultated  Respiratory: clear to auscultation bilaterally    Neurological examination  Mentation: Alert oriented to time, place, history taking. Follows  all commands speech and language fluent Cranial nerve II-XII: Pupils were equal round reactive to light. Extraocular movements were full, visual field were full on confrontational test. Facial sensation and strength were normal. Uvula tongue midline. Head turning and shoulder shrug  were normal and symmetric. Motor: The motor testing reveals 5 over 5 strength of all 4 extremities.  Sensory: Sensory testing is intact to soft touch on all 4 extremities. No evidence of extinction is noted.  Coordination: Cerebellar testing reveals good finger-nose-finger and heel-to-shin bilaterally.  Gait and station: Gait is slightly wide Reflexes: Deep tendon reflexes are symmetric and normal bilaterally.    DIAGNOSTIC DATA (LABS, IMAGING, TESTING) - I reviewed patient records, labs, notes, testing and imaging myself where available.  Lab Results  Component Value Date   WBC 10.0 02/18/2021   HGB 13.3 02/18/2021   HCT 41.7 02/18/2021   MCV 85 02/18/2021   PLT 237 02/18/2021      Component Value Date/Time   NA 140 02/18/2021 1459   K 4.3 02/18/2021 1459   CL 103 02/18/2021 1459   CO2 24 02/18/2021 1459   GLUCOSE 86 02/18/2021 1459   BUN 15 02/18/2021 1459   CREATININE 0.85 02/18/2021 1459   CALCIUM 9.8 02/18/2021 1459   PROT 6.8 02/18/2021 1459   ALBUMIN 4.5 02/18/2021 1459   AST 20 02/18/2021 1459   ALT 21 02/18/2021 1459   ALKPHOS 90 02/18/2021 1459   BILITOT <0.2 02/18/2021 1459   GFRNONAA 92 01/26/2019 1217   GFRAA 106 01/26/2019 1217   No results found for: "CHOL", "HDL", "LDLCALC", "LDLDIRECT", "TRIG", "CHOLHDL" No results found for: "HGBA1C" No results found for: "VITAMINB12" No results found for: "TSH"      No data to display               No data to display           ASSESSMENT AND PLAN  67 y.o. year old female  has a past medical history of Movement disorder, Multiple sclerosis (HCC), and Vision abnormalities. here with    Multiple sclerosis (HCC) - Plan: MR  BRAIN W WO CONTRAST, CBC with Differential/Platelets, CMP  High risk medication use  Gait disturbance  Dysesthesia  Attention deficit disorder, unspecified hyperactivity presence  Overactive bladder - Plan: oxybutynin (DITROPAN) 5 MG tablet  Agitated depression (HCC)  Jill Rogers reports having a bad year with multiple falls and more depression. We have discussed nature of falls. She was encouraged to use her cane or walker at all times. I have offered PT but she declines at this time. We have discussed mood management. She stopped Abilify as she was feeling well and she is not sure she wishes to resume. I have encouraged her to consider grief counseling. She will continue duloxetine 60mg  daily. She will also continue lamotrigine amantadine,meloxicam, clonazepam, hydroxyzine, and oxybutynin as prescribed. May increase dextrostat  up to 10mg  TID. PDMP appropriate. She will call when refills of PRN meds needed. I will update MRI and labs. Fall precautions advised. Healthy lifestyle habits encouraged. She will follow up with Dr Felecia Shelling in 6 months.    Orders Placed This Encounter  Procedures   MR BRAIN W WO CONTRAST    Standing Status:   Future    Standing Expiration Date:   02/20/2023    Order Specific Question:   If indicated for the ordered procedure, I authorize the administration of contrast media per Radiology protocol    Answer:   Yes    Order Specific Question:   What is the patient's sedation requirement?    Answer:   No Sedation    Order Specific Question:   Does the patient have a pacemaker or implanted devices?    Answer:   No    Order Specific Question:   Preferred imaging location?    Answer:   External   CBC with Differential/Platelets   CMP      Meds ordered this encounter  Medications   oxybutynin (DITROPAN) 5 MG tablet    Sig: Take 1 tablet (5 mg total) by mouth 2 (two) times daily.    Dispense:  180 tablet    Refill:  1    Order Specific Question:   Supervising  Provider    Answer:   Melvenia Beam [[id number]]   lamoTRIgine (LAMICTAL) 100 MG tablet    Sig: Take 1 tablet (100 mg total) by mouth 2 (two) times daily.    Dispense:  180 tablet    Refill:  1    Order Specific Question:   Supervising Provider    Answer:   Melvenia Beam [[id number]]   DULoxetine (CYMBALTA) 60 MG capsule    Sig: TAKE 1 CAPSULE BY MOUTH EVERY DAY    Dispense:  90 capsule    Refill:  1    Order Specific Question:   Supervising Provider    Answer:   Melvenia Beam [[id number]]     Jill WPYKD, MSN, FNP-C 02/19/2022, 2:21 PM  [facility] [address] [place], [place] [address] [phone]

## 2022-02-19 NOTE — Patient Instructions (Addendum)
Below is our plan:  We will continue current treatment plan. We will update MRI and labs. Consider PT for gait evaluation. Consider psychology/psychiatry to help with mood management. Try taking dextrostat in am and at noon. Call me when you need a refill.   Please make sure you are staying well hydrated. I recommend 50-60 ounces daily. Well balanced diet and regular exercise encouraged. Consistent sleep schedule with 6-8 hours recommended.   Please continue follow up with care team as directed.   Follow up with Dr Felecia Shelling in 6 months   You may receive a survey regarding today's visit. I encourage you to leave honest feed back as I do use this information to improve patient care. Thank you for seeing me today!

## 2022-02-20 LAB — CBC WITH DIFFERENTIAL/PLATELET
Basophils Absolute: 0.1 10*3/uL (ref 0.0–0.2)
Basos: 1 %
EOS (ABSOLUTE): 0.1 10*3/uL (ref 0.0–0.4)
Eos: 1 %
Hematocrit: 43.1 % (ref 34.0–46.6)
Hemoglobin: 13.7 g/dL (ref 11.1–15.9)
Immature Grans (Abs): 0 10*3/uL (ref 0.0–0.1)
Immature Granulocytes: 0 %
Lymphocytes Absolute: 2.3 10*3/uL (ref 0.7–3.1)
Lymphs: 26 %
MCH: 27.5 pg (ref 26.6–33.0)
MCHC: 31.8 g/dL (ref 31.5–35.7)
MCV: 87 fL (ref 79–97)
Monocytes Absolute: 0.5 10*3/uL (ref 0.1–0.9)
Monocytes: 6 %
Neutrophils Absolute: 5.6 10*3/uL (ref 1.4–7.0)
Neutrophils: 66 %
Platelets: 220 10*3/uL (ref 150–450)
RBC: 4.98 x10E6/uL (ref 3.77–5.28)
RDW: 13.7 % (ref 11.7–15.4)
WBC: 8.6 10*3/uL (ref 3.4–10.8)

## 2022-02-20 LAB — COMPREHENSIVE METABOLIC PANEL
ALT: 18 IU/L (ref 0–32)
AST: 17 IU/L (ref 0–40)
Albumin/Globulin Ratio: 1.7 (ref 1.2–2.2)
Albumin: 4.4 g/dL (ref 3.9–4.9)
Alkaline Phosphatase: 88 IU/L (ref 44–121)
BUN/Creatinine Ratio: 22 (ref 12–28)
BUN: 17 mg/dL (ref 8–27)
Bilirubin Total: 0.3 mg/dL (ref 0.0–1.2)
CO2: 23 mmol/L (ref 20–29)
Calcium: 9.8 mg/dL (ref 8.7–10.3)
Chloride: 103 mmol/L (ref 96–106)
Creatinine, Ser: 0.76 mg/dL (ref 0.57–1.00)
Globulin, Total: 2.6 g/dL (ref 1.5–4.5)
Glucose: 120 mg/dL — ABNORMAL HIGH (ref 70–99)
Potassium: 4.3 mmol/L (ref 3.5–5.2)
Sodium: 141 mmol/L (ref 134–144)
Total Protein: 7 g/dL (ref 6.0–8.5)
eGFR: 87 mL/min/{1.73_m2} (ref >59)

## 2022-03-03 ENCOUNTER — Other Ambulatory Visit: Payer: Self-pay | Admitting: Neurology

## 2022-03-10 ENCOUNTER — Inpatient Hospital Stay: Admission: RE | Admit: 2022-03-10 | Payer: Medicare Other | Source: Ambulatory Visit

## 2022-03-11 ENCOUNTER — Ambulatory Visit
Admission: RE | Admit: 2022-03-11 | Discharge: 2022-03-11 | Disposition: A | Discharge status: Home / Self Care | Payer: Medicare Other | Source: Ambulatory Visit | Attending: Family Medicine | Admitting: Family Medicine

## 2022-03-11 DIAGNOSIS — G35 Multiple sclerosis: Secondary | ICD-10-CM

## 2022-03-11 MED ORDER — GADOPICLENOL 0.5 MMOL/ML IV SOLN
10.0000 mL | Freq: Once | INTRAVENOUS | Status: AC | PRN
  Administered 2022-03-11: 10 mL via INTRAVENOUS

## 2022-03-13 ENCOUNTER — Telehealth: Payer: Self-pay | Admitting: Neurology

## 2022-03-13 NOTE — Telephone Encounter (Signed)
LVM and sent mychart msg informing pt of need to reschedule 08/21/22 appointment - MD out 

## 2022-04-25 ENCOUNTER — Other Ambulatory Visit: Payer: Self-pay | Admitting: Neurology

## 2022-04-25 NOTE — Telephone Encounter (Signed)
Pt was seen on 02/19/22 per note "Meloxicam 7.5mg  QD PRN for knee pain " No follow up scheduled

## 2022-04-29 ENCOUNTER — Other Ambulatory Visit: Payer: Self-pay | Admitting: Neurology

## 2022-06-11 ENCOUNTER — Other Ambulatory Visit: Payer: Self-pay | Admitting: Neurology

## 2022-06-12 MED ORDER — DEXTROAMPHETAMINE SULFATE 10 MG PO TABS: 10.0000 mg | ORAL_TABLET | Freq: Three times a day (TID) | ORAL | 0 refills | Status: DC

## 2022-06-12 NOTE — Telephone Encounter (Signed)
Pt last seen 02/19/22. Last refilled rx 07/23/21 #270 per drug registry.

## 2022-06-17 ENCOUNTER — Other Ambulatory Visit: Payer: Self-pay | Admitting: Neurology

## 2022-06-17 MED ORDER — DEXTROAMPHETAMINE SULFATE 10 MG PO TABS: 10.0000 mg | ORAL_TABLET | Freq: Three times a day (TID) | ORAL | 0 refills | Status: DC

## 2022-06-17 NOTE — Telephone Encounter (Signed)
Pt requesting refill on dextroamphetamine (DEXTROSTAT) 10 MG tablet. Should be sent to [facility] Phone: [phone] Fax: [phone]. Pt stated she is out of medication and need it sent to pharmacy today.

## 2022-06-17 NOTE — Telephone Encounter (Signed)
Pt last seen 02/19/22. Last refilled 07/23/21 #270. Appears refill sent to [facility] 06/11/22 by AL,NP:  I called pt. [facility] mail order now preferred. Needs refill sent there instead. Aware I will send request to AL,NP to send in.  I called [facility]. Spoke w/ Zella Ball. States pt profile inactive.

## 2022-06-19 ENCOUNTER — Other Ambulatory Visit: Payer: Self-pay | Admitting: *Deleted

## 2022-06-19 MED ORDER — AMANTADINE HCL 100 MG PO TABS: 100.0000 mg | ORAL_TABLET | Freq: Two times a day (BID) | ORAL | 2 refills | Status: DC

## 2022-07-17 ENCOUNTER — Telehealth: Payer: Self-pay | Admitting: Neurology

## 2022-07-17 NOTE — Telephone Encounter (Signed)
Called pt. Reports that she initially was having trouble passing stools. Then, hse started having black tarry stools around end April/first week of May 2024. Went and saw Karma Lew (GI) at [facility] end of May 2024 for futher eval/treatment. She completed colonoscopy, endoscopy. Could not find any reason for black tarry stools. Started on Linzess. Unable to have BM unless she takes Miralax qod. She is concerned about this. Worried about becoming to dependent on laxatives.  Alternate option: Beneficial. But she went with Miralax.   I advised that typically, we advise pt to see GI for bowel issues. She wanted to know if Dr. Epimenio Foot has any other recommendations. Would like him to review.   She has been off Tecfidera for last couple years. Could not get funding and never went back on.

## 2022-07-17 NOTE — Telephone Encounter (Deleted)
Notified [facility]. They will keep eye out for denial letter. Once they receive, we will have MD review 

## 2022-07-17 NOTE — Telephone Encounter (Signed)
Called pt. Relayed Dr. Sater's message. Pt verbalized understanding and appreciation. 

## 2022-07-17 NOTE — Telephone Encounter (Signed)
Pt stated she needs to talk to nurse about her bowels. Stated she think it's her MS acting up. Pt requesting a call back from nurse.

## 2022-08-19 ENCOUNTER — Telehealth: Payer: Self-pay | Admitting: Neurology

## 2022-08-19 ENCOUNTER — Other Ambulatory Visit: Payer: Self-pay

## 2022-08-19 DIAGNOSIS — N3281 Overactive bladder: Secondary | ICD-10-CM

## 2022-08-19 MED ORDER — LAMOTRIGINE 100 MG PO TABS: 100.0000 mg | ORAL_TABLET | Freq: Two times a day (BID) | ORAL | 1 refills | Status: DC

## 2022-08-19 MED ORDER — OXYBUTYNIN CHLORIDE 5 MG PO TABS
5.0000 mg | ORAL_TABLET | Freq: Two times a day (BID) | ORAL | 1 refills | Status: DC
Start: 2022-08-19 — End: 2023-03-26

## 2022-08-19 NOTE — Telephone Encounter (Signed)
Prescriptions sent in

## 2022-08-19 NOTE — Telephone Encounter (Signed)
Pt stated she needs refill on oxybutynin (DITROPAN) 5 MG tablet and lamoTRIgine (LAMICTAL) 100 MG tablet. Refill should be sent to [facility]

## 2022-08-21 ENCOUNTER — Ambulatory Visit: Payer: BLUE CROSS/BLUE SHIELD | Admitting: Neurology

## 2022-08-25 ENCOUNTER — Ambulatory Visit: Payer: Medicare Other | Admitting: Family Medicine

## 2022-08-27 ENCOUNTER — Ambulatory Visit: Payer: Medicare Other | Admitting: Neurology

## 2022-08-27 ENCOUNTER — Encounter: Payer: Self-pay | Admitting: Neurology

## 2022-08-27 VITALS — BP 136/81 | HR 88 | Ht 63.0 in | Wt 213.8 lb

## 2022-08-27 DIAGNOSIS — E559 Vitamin D deficiency, unspecified: Secondary | ICD-10-CM | POA: Diagnosis not present

## 2022-08-27 DIAGNOSIS — R208 Other disturbances of skin sensation: Secondary | ICD-10-CM

## 2022-08-27 DIAGNOSIS — G4700 Insomnia, unspecified: Secondary | ICD-10-CM

## 2022-08-27 DIAGNOSIS — Z117 Encounter for testing for latent tuberculosis infection: Secondary | ICD-10-CM

## 2022-08-27 DIAGNOSIS — Z79899 Other long term (current) drug therapy: Secondary | ICD-10-CM

## 2022-08-27 DIAGNOSIS — G35 Multiple sclerosis: Secondary | ICD-10-CM | POA: Diagnosis not present

## 2022-08-27 DIAGNOSIS — R269 Unspecified abnormalities of gait and mobility: Secondary | ICD-10-CM

## 2022-08-27 DIAGNOSIS — F322 Major depressive disorder, single episode, severe without psychotic features: Secondary | ICD-10-CM

## 2022-08-27 MED ORDER — AMANTADINE HCL 100 MG PO TABS: 100.0000 mg | ORAL_TABLET | Freq: Two times a day (BID) | ORAL | 3 refills | Status: DC

## 2022-08-27 MED ORDER — TERIFLUNOMIDE 14 MG PO TABS: ORAL_TABLET | ORAL | 3 refills | Status: DC

## 2022-08-27 NOTE — Patient Instructions (Signed)
[email or web address]  Then do sign in --- then sign up

## 2022-08-27 NOTE — Progress Notes (Signed)
[facility]  PATIENT: Jill Rogers DOB: 26-Jun-1955  REFERRING DOCTOR OR PCP:  None SOURCE: patient  _________________________________   HISTORICAL  CHIEF COMPLAINT:  Chief Complaint  Patient presents with   Room 11    Pt is here Alone. Pt states that she has been fighting the heat which is giving her a lot of Fatigue. Pt states that he balance is still off. Pt states that she still has some numbness in her right hand. Pt is wanting to discuss if she needs to be taking about whether or not she should be taking Vitamin B.    HISTORY OF PRESENT ILLNESS:  Dafne Nield is a 67 y.o. female with relapsing remitting multiple sclerosis.     Update 08/27/2022 She stopped Tecfidera (could not get free drug) last year.   Generic dimethyl fumarate was going to be too expensive for her.  She tried Vumerity but had a lot of itching and stopped.     When she was having difficulty with the medications and based on the fact that she had gone many years without any exacerbation or MRI change, we decided to see how she would do off of disease modifying therapies.  Unfortunately, she failed that gait and cognition worsened when she was not on a disease modifying therapy.  Therefore she would like to get back on it.  Her left clear-cut exacerbation had been 7 or 8 years earlier.  MRIs have been stable.    The brain MRI 04/18/2019 showed no bew lesions compared to 2016.  The MRI of the cervical spine 06/23/2020 showed normal spinal cord.  She did have degenerative changes but nothing that would affect her leg strength.  She feels her cognitive fog is worse.   She has felt very tired and poorly focused.     She was on dextroamphetamine and then had trouble with cost so was placed on phentermine for a while.  She felt the phentermine did not help as much.     She feels gait is about the same this month as last month though she felt she was doing better a couple years ago..    She has had no  recent falls   She wants to do activities but feels too tired to do so.      She feels mood is better since Abilify was added to duloxetine and lamotrigine.    She is on Cymbalta and lamotrigine   She is not seeing psychiatry but sees a counselor behavioral health.          MS History:   She was diagnosed in 1983 after she presented with right hand weakness and a limp.   She had an MRI consistent with MS and she did not need to have a LP.    Those symptoms improved after a few weeks.     She was initially placed on Symmetrel for her fatigue and received some steroids (oral or IV) several times.    She was started on Avonex in 1994.   She switched to Rebif 5 years ago after an exacerbation treated with IV solu-medrol.    Also during that time, her husband left and she was under a lot of stress.    She started seeing me around 2013.  Her last exacerbation was 2014 with some gait issues.   She tolerates Rebif well but has had some bruising at times.       She switched to Vumerity in 2020.  Due  to side effects and being stable for many years, DMT's were discontinued in 2021.   Insurance would not cover Tecfidera and DMF was poorly tolerated.   She started Aubagio generic 08/2022  IMAGING MRI 04/18/2019 showed T2/FLAIR hyperintense foci in the hemispheres and also a few foci in the pons and cerebellum in a pattern and configuration consistent with chronic demyelinating plaque associated with multiple sclerosis.  None of the foci enhance after contrast.  Compared to the MRI from 2016, there is no definite interim change.  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  MRI of the brain 12/10/2019 showed no new lesions.  MRI of the cervical spine 03/28/2016 showed a normal spinal cord and degenerative changes that could affect the C5 nerve roots at C4-C5 and the left C6 nerve root at C5-C6   REVIEW OF SYSTEMS: Constitutional: No fevers, chills, sweats, or change in  appetite.   Fatigue.   She reports excessive sleepines Eyes: No visual changes.   Notes  double vision.  No eye pain Ear, nose and throat: No hearing loss, ear pain, nasal congestion, sore throat.  Recent ear infection Cardiovascular: No chest pain, palpitations Respiratory:  No shortness of breath at rest or with exertion.   No wheezes GastrointestinaI: No nausea, vomiting, diarrhea, abdominal pain, fecal incontinence Genitourinary: see above Musculoskeletal:  Mild neck pain, back pain.   Muscles stiff Integumentary: No rash, pruritus.   Has boils/abscesses Neurological: as above Psychiatric: see above Endocrine: No palpitations, diaphoresis, change in appetite, change in weigh or increased thirst Hematologic/Lymphatic:  No anemia, petechiae.   Bruises easily at injeciton sites Allergic/Immunologic: No itchy/runny eyes, nasal congestion, recent allergic reactions, rashes  ALLERGIES: Allergies  Allergen Reactions   Hydrocodone Nausea And Vomiting   Oxycodone Nausea And Vomiting   Tape     bandaids rip her skin/fim   Vumerity [Diroximel Fumarate]     welts    HOME MEDICATIONS:  Current Outpatient Medications:    alendronate (FOSAMAX) 70 MG tablet, Take 70 mg by mouth once a week., Disp: , Rfl:    atorvastatin (LIPITOR) 10 MG tablet, Take 10 mg by mouth daily., Disp: , Rfl: 0   Calcium Carbonate (CALCIUM 600 PO), Take 1 tablet by mouth daily., Disp: , Rfl:    Camphor-Menthol-Methyl Sal (SALONPAS EX), Apply 1 application topically as needed (for knee)., Disp: , Rfl:    Cholecalciferol (VITAMIN D3 PO), Take 1,000 Units by mouth in the morning and at bedtime., Disp: , Rfl:    clonazePAM (KLONOPIN) 0.5 MG tablet, TAKE 1 TABLET BY MOUTH TWICE A DAY AS NEEDED FOR ANXIETY, Disp: 60 tablet, Rfl: 5   co-enzyme Q-10 30 MG capsule, Take 200 mg by mouth daily. , Disp: , Rfl:    dextroamphetamine (DEXTROSTAT) 10 MG tablet, Take 1 tablet (10 mg total) by mouth 3 (three) times daily. Must call  [phone] to schedule follow up for ongoing refills, Disp: 270 tablet, Rfl: 0   diphenhydrAMINE (BENADRYL) 25 MG tablet, Take 25 mg by mouth as needed for allergies., Disp: , Rfl:    DULoxetine (CYMBALTA) 60 MG capsule, TAKE 1 CAPSULE BY MOUTH EVERY DAY, Disp: 90 capsule, Rfl: 1   hydrochlorothiazide (HYDRODIURIL) 25 MG tablet, Take 25 mg by mouth daily., Disp: , Rfl:    hydrOXYzine (VISTARIL) 25 MG capsule, Take 1 capsule (25 mg total) by mouth every 8 (eight) hours as needed., Disp: 270 capsule, Rfl: 1   indomethacin (INDOCIN) 25 MG capsule, One  po tid prn headache, Disp: 90 capsule, Rfl: 0   KRILL OIL/ASTAXANTHIN 1000 MG CAPS, Take 500 mg by mouth 2 (two) times a day. , Disp: , Rfl:    lamoTRIgine (LAMICTAL) 100 MG tablet, Take 1 tablet (100 mg total) by mouth 2 (two) times daily., Disp: 180 tablet, Rfl: 1   meloxicam (MOBIC) 7.5 MG tablet, TAKE 1 TABLET BY MOUTH EVERY DAY AS NEEDED KNEE PAIN, Disp: 90 tablet, Rfl: 1   OVER THE COUNTER MEDICATION, Take 1 Dose by mouth as needed. Hemp seed/metcha, Disp: , Rfl:    oxybutynin (DITROPAN) 5 MG tablet, Take 1 tablet (5 mg total) by mouth 2 (two) times daily., Disp: 180 tablet, Rfl: 1   pantoprazole (PROTONIX) 40 MG tablet, Take 40 mg by mouth daily., Disp: , Rfl:    Teriflunomide 14 MG TABS, One po qd, Disp: 90 tablet, Rfl: 3   Amantadine HCl 100 MG tablet, Take 1 tablet (100 mg total) by mouth 2 (two) times daily., Disp: 180 tablet, Rfl: 3   b complex vitamins capsule, Take 1 capsule by mouth daily. (Patient not taking: Reported on 08/27/2022), Disp: , Rfl:    ELDERBERRY PO, Take 1 Dose by mouth as needed. Takes 1tsp when needed (Patient not taking: Reported on 08/27/2022), Disp: , Rfl:   PAST MEDICAL HISTORY: Past Medical History:  Diagnosis Date   Movement disorder    Multiple sclerosis (HCC)    Vision abnormalities     PAST SURGICAL HISTORY: Past Surgical History:  Procedure Laterality Date   arm surgery Right    fall 2023   CERVICAL  CONE BIOPSY     CHOLECYSTECTOMY, LAPAROSCOPIC      FAMILY HISTORY: Family History  Problem Relation Age of Onset   Diabetes type II Mother    Arthritis Mother    Heart failure Father    Diabetes type II Father     SOCIAL HISTORY:  Social History   Socioeconomic History   Marital status: Divorced    Spouse name: Not on file   Number of children: Not on file   Years of education: Not on file   Highest education level: Not on file  Occupational History   Not on file  Tobacco Use   Smoking status: Former    Current packs/day: 0.50    Types: Cigarettes   Smokeless tobacco: Never  Substance and Sexual Activity   Alcohol use: Yes    Alcohol/week: 0.0 standard drinks of alcohol    Comment: occasional/fim   Drug use: No   Sexual activity: Not on file  Other Topics Concern   Not on file  Social History Narrative   Not on file   Social Determinants of Health   Financial Resource Strain: Not on file  Food Insecurity: Not on file  Transportation Needs: Not on file  Physical Activity: Not on file  Stress: Not on file  Social Connections: Not on file  Intimate Partner Violence: Not on file     PHYSICAL EXAM  Vitals:   08/27/22 0846  BP: 136/81  Pulse: 88  Weight: 213 lb 12.8 oz (97 kg)  Height: 5\' 3"  (1.6 m)    Body mass index is 37.87 kg/m.   General: The patient is well-developed and well-nourished.  She is not agitated today. Neurologic Exam  Mental status: The patient is alert and oriented x 3 at the time of the examination. The patient has apparent normal recent and remote memory, with an apparently normal attention span and  concentration ability.   Speech is normal.  Cranial nerves: Extraocular movements are full.  Facial strength and sensation was normal.  Trapezius strength was normal.   No obvious hearing deficits are noted.  Motor:  Muscle bulk is normal.   She has mildly increased tone in the legs, R>L.Marland Kitchen Strength is  5 / 5 in arms and  legs.  Sensory: She has intact sensation to touch in the arms and lower legs   Coordination: Cerebellar testing shows good finger-nose-finger.  Gait and station: Station is normal.   Gait is mildly wide.  Tandem gait is poor.   Romberg is negative..   Reflexes: Deep tendon reflexes are normal in the arms but mildly  increased in right leg.        Multiple sclerosis (HCC) - Plan: QuantiFERON-TB Gold Plus  High risk medication use - Plan: QuantiFERON-TB Gold Plus  Encounter for testing for latent tuberculosis - Plan: QuantiFERON-TB Gold Plus  Vitamin D deficiency - Plan: VITAMIN D 25 Hydroxy (Vit-D Deficiency, Fractures)  Dysesthesia  Gait disturbance  Agitated depression (HCC)  Insomnia, unspecified type   1.   Change to Aubagio as cannot get Tecfidera ad s.e. on dimethyl fumarate.  Her last MRI was stable.    2.   Mood is doing better on the current medications.  She will continue these.    3.   Stay active and exercise as tolerated 4.   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 5.    return in 6 months or sooner if there are new or worsening neurologic symptoms.    This visit is part of a comprehensive longitudinal care medical relationship regarding the patients primary diagnosis of MS and related concerns.  42-minute office visit with the majority of the time spent face-to-face for history and physical, discussion/counseling and decision-making.  Additional time with record review and documentation.    Marycruz Boehner A. Epimenio Foot, MD, PhD 08/27/2022, 9:41 AM Certified in Neurology, Clinical Neurophysiology, Sleep Medicine, Pain Medicine and Neuroimaging  [facility] [address], [address] [place], [place] [address] [phone]

## 2022-08-29 LAB — QUANTIFERON-TB GOLD PLUS

## 2022-08-29 LAB — VITAMIN D 25 HYDROXY (VIT D DEFICIENCY, FRACTURES): Vit D, 25-Hydroxy: 42.7 ng/mL (ref 30.0–100.0)

## 2022-08-30 LAB — QUANTIFERON-TB GOLD PLUS
QuantiFERON Nil Value: 0.05 IU/mL
QuantiFERON TB1 Ag Value: 0.08 IU/mL
QuantiFERON TB2 Ag Value: 0.06 IU/mL
QuantiFERON-TB Gold Plus: NEGATIVE

## 2023-01-15 ENCOUNTER — Telehealth: Payer: Self-pay | Admitting: Neurology

## 2023-01-15 MED ORDER — DULOXETINE HCL 60 MG PO CPEP: ORAL_CAPSULE | ORAL | 1 refills | Status: DC

## 2023-01-15 NOTE — Telephone Encounter (Signed)
E-scribed refill 

## 2023-01-15 NOTE — Telephone Encounter (Signed)
Pt request refill for DULoxetine (CYMBALTA) 60 MG capsule send to   [facility] [address] [place], [place] [address] Phone: [phone]

## 2023-02-26 ENCOUNTER — Ambulatory Visit: Payer: PPO | Admitting: Neurology

## 2023-02-26 ENCOUNTER — Encounter: Payer: Self-pay | Admitting: Neurology

## 2023-02-26 VITALS — BP 149/64 | HR 93 | Ht 63.0 in | Wt 209.5 lb

## 2023-02-26 DIAGNOSIS — G35 Multiple sclerosis: Secondary | ICD-10-CM | POA: Diagnosis not present

## 2023-02-26 DIAGNOSIS — R269 Unspecified abnormalities of gait and mobility: Secondary | ICD-10-CM

## 2023-02-26 DIAGNOSIS — Z79899 Other long term (current) drug therapy: Secondary | ICD-10-CM

## 2023-02-26 DIAGNOSIS — E559 Vitamin D deficiency, unspecified: Secondary | ICD-10-CM

## 2023-02-26 DIAGNOSIS — R208 Other disturbances of skin sensation: Secondary | ICD-10-CM

## 2023-02-26 DIAGNOSIS — Z117 Encounter for testing for latent tuberculosis infection: Secondary | ICD-10-CM

## 2023-02-26 DIAGNOSIS — M51369 Other intervertebral disc degeneration, lumbar region without mention of lumbar back pain or lower extremity pain: Secondary | ICD-10-CM

## 2023-02-26 MED ORDER — CLONAZEPAM 0.5 MG PO TABS: 0.5000 mg | ORAL_TABLET | Freq: Two times a day (BID) | ORAL | 3 refills | Status: DC | PRN

## 2023-02-26 MED ORDER — TERIFLUNOMIDE 14 MG PO TABS: ORAL_TABLET | ORAL | 3 refills | Status: DC

## 2023-02-26 NOTE — Progress Notes (Signed)
[facility]  PATIENT: Jill Rogers DOB: 1955-11-12  REFERRING DOCTOR OR PCP:  None SOURCE: patient  _________________________________   HISTORICAL  CHIEF COMPLAINT:  Chief Complaint  Patient presents with   Room 11    Pt is here Alone. Pt states that things have been okay with her MS since last appointment. Pt states that she has spinal stenosis. Pt  states that she has been doing physical therapy and it has been helping with her gait issues and her weakness. Pt fell in December and hit her head,but didn't lose consciousness.      HISTORY OF PRESENT ILLNESS:  Jill Rogers is a 68 y.o. female with relapsing remitting multiple sclerosis.     Update 08/27/2022 She stopped Tecfidera (could not get free drug) last year and switched to teriflunomide..   Generic dimethyl fumarate was going to be too expensive for her.  She tried Vumerity but had a lot of itching and stopped.    MRI brain 03/11/2022 shows multiple T2/FLAIR hyperintense foci in the cerebral hemispheres, pons and cerebellum in a pattern consistent with chronic demyelinating plaque associated with multiple sclerosis. None of the foci enhanced or appear to be acute. Compared to the MRI from 12/10/2019, there were no new lesions     The MRI of the cervical spine 06/23/2020 showed normal spinal cord.  She did have degenerative changes but nothing that would affect her leg strength.  She feels her gait is doing worse.  She uses a cane.   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  She has not had an MRI.      She is doing PT.   She feels her bowels are doing better.  She feels the right leg is weaker than left.       She feels her cognitive fog is worse.   She has felt very tired and poorly focused.     She was on dextroamphetamine and then had trouble with cost so was placed on phentermine for a while.  She felt the phentermine did not help as much.   .      She feels mood is  better since startig PT and noticing improved gait.  She is on duloxetine and lamotrigine.   She is not seeing psychiatry x several years         MS History:   She was diagnosed in 50 after she presented with right hand weakness and a limp.   She had an MRI consistent with MS and she did not need to have a LP.    Those symptoms improved after a few weeks.     She was initially placed on Symmetrel for her fatigue and received some steroids (oral or IV) several times.    She was started on Avonex in 1994.   She switched to Rebif 5 years ago after an exacerbation treated with IV solu-medrol.    Also during that time, her husband left and she was under a lot of stress.    She started seeing me around 2013.  Her last exacerbation was 2014 with some gait issues.   She tolerates Rebif well but has had some bruising at times.       She switched to Vumerity in 2020.  Due to side effects and being stable for many years, DMT's were discontinued in 2021.   Insurance would not cover Tecfidera and DMF was poorly tolerated.   She started  Aubagio generic 08/2022  IMAGING MRI 04/18/2019 showed T2/FLAIR hyperintense foci in the hemispheres and also a few foci in the pons and cerebellum in a pattern and configuration consistent with chronic demyelinating plaque associated with multiple sclerosis.  None of the foci enhance after contrast.  Compared to the MRI from 2016, there is no definite interim change.  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  MRI of the brain 12/10/2019 showed no new lesions.  MRI of the cervical spine 03/28/2016 showed a normal spinal cord and degenerative changes that could affect the C5 nerve roots at C4-C5 and the left C6 nerve root at C5-C6  MRI brain 03/11/2022 shows multiple T2/FLAIR hyperintense foci in the cerebral hemispheres, pons and cerebellum in a pattern consistent with chronic demyelinating plaque associated with multiple sclerosis. None  of the foci enhanced or appear to be acute. Compared to the MRI from 12/10/2019, there were no new lesions       The MRI of the cervical spine 06/23/2020 showed normal spinal cord.  She did have degenerative changes but nothing that would affect her leg strength.   REVIEW OF SYSTEMS: Constitutional: No fevers, chills, sweats, or change in appetite.   Fatigue.   She reports excessive sleepines Eyes: No visual changes.   Notes  double vision.  No eye pain Ear, nose and throat: No hearing loss, ear pain, nasal congestion, sore throat.  Recent ear infection Cardiovascular: No chest pain, palpitations Respiratory:  No shortness of breath at rest or with exertion.   No wheezes GastrointestinaI: No nausea, vomiting, diarrhea, abdominal pain, fecal incontinence Genitourinary: see above Musculoskeletal:  Mild neck pain, back pain.   Muscles stiff Integumentary: No rash, pruritus.   Has boils/abscesses Neurological: as above Psychiatric: see above Endocrine: No palpitations, diaphoresis, change in appetite, change in weigh or increased thirst Hematologic/Lymphatic:  No anemia, petechiae.   Bruises easily at injeciton sites Allergic/Immunologic: No itchy/runny eyes, nasal congestion, recent allergic reactions, rashes  ALLERGIES: Allergies  Allergen Reactions   Hydrocodone Nausea And Vomiting   Oxycodone Nausea And Vomiting   Tape     bandaids rip her skin/fim   Vumerity [Diroximel Fumarate]     welts    HOME MEDICATIONS:  Current Outpatient Medications:    alendronate (FOSAMAX) 70 MG tablet, Take 70 mg by mouth once a week., Disp: , Rfl:    Amantadine HCl 100 MG tablet, Take 1 tablet (100 mg total) by mouth 2 (two) times daily., Disp: 180 tablet, Rfl: 3   atorvastatin (LIPITOR) 10 MG tablet, Take 10 mg by mouth daily., Disp: , Rfl: 0   b complex vitamins capsule, Take 1 capsule by mouth daily., Disp: , Rfl:    Calcium Carbonate (CALCIUM 600 PO), Take 1 tablet by mouth daily., Disp: , Rfl:     Camphor-Menthol-Methyl Sal (SALONPAS EX), Apply 1 application topically as needed (for knee)., Disp: , Rfl:    Cholecalciferol (VITAMIN D3 PO), Take 1,000 Units by mouth in the morning and at bedtime., Disp: , Rfl:    co-enzyme Q-10 30 MG capsule, Take 200 mg by mouth daily. , Disp: , Rfl:    dextroamphetamine (DEXTROSTAT) 10 MG tablet, Take 1 tablet (10 mg total) by mouth 3 (three) times daily. Must call [phone] to schedule follow up for ongoing refills, Disp: 270 tablet, Rfl: 0   diphenhydrAMINE (BENADRYL) 25 MG tablet, Take 25 mg by mouth as needed for allergies., Disp: , Rfl:    DULoxetine (CYMBALTA)  60 MG capsule, TAKE 1 CAPSULE BY MOUTH EVERY DAY, Disp: 90 capsule, Rfl: 1   hydrochlorothiazide (HYDRODIURIL) 25 MG tablet, Take 25 mg by mouth daily., Disp: , Rfl:    hydrOXYzine (VISTARIL) 25 MG capsule, Take 1 capsule (25 mg total) by mouth every 8 (eight) hours as needed., Disp: 270 capsule, Rfl: 1   indomethacin (INDOCIN) 25 MG capsule, One po tid prn headache, Disp: 90 capsule, Rfl: 0   KRILL OIL/ASTAXANTHIN 1000 MG CAPS, Take 500 mg by mouth 2 (two) times a day. , Disp: , Rfl:    lamoTRIgine (LAMICTAL) 100 MG tablet, Take 1 tablet (100 mg total) by mouth 2 (two) times daily., Disp: 180 tablet, Rfl: 1   meloxicam (MOBIC) 7.5 MG tablet, TAKE 1 TABLET BY MOUTH EVERY DAY AS NEEDED KNEE PAIN, Disp: 90 tablet, Rfl: 1   OVER THE COUNTER MEDICATION, Take 1 Dose by mouth as needed. Hemp seed/metcha, Disp: , Rfl:    oxybutynin (DITROPAN) 5 MG tablet, Take 1 tablet (5 mg total) by mouth 2 (two) times daily., Disp: 180 tablet, Rfl: 1   pantoprazole (PROTONIX) 40 MG tablet, Take 40 mg by mouth daily., Disp: , Rfl:    clonazePAM (KLONOPIN) 0.5 MG tablet, Take 1 tablet (0.5 mg total) by mouth 2 (two) times daily as needed for anxiety., Disp: 60 tablet, Rfl: 3   ELDERBERRY PO, Take 1 Dose by mouth as needed. Takes 1tsp when needed (Patient not taking: Reported on 02/26/2023), Disp: , Rfl:     Teriflunomide 14 MG TABS, One po qd, Disp: 90 tablet, Rfl: 3  PAST MEDICAL HISTORY: Past Medical History:  Diagnosis Date   Movement disorder    Multiple sclerosis (HCC)    Vision abnormalities     PAST SURGICAL HISTORY: Past Surgical History:  Procedure Laterality Date   arm surgery Right    fall 2023   CERVICAL CONE BIOPSY     CHOLECYSTECTOMY, LAPAROSCOPIC      FAMILY HISTORY: Family History  Problem Relation Age of Onset   Diabetes type II Mother    Arthritis Mother    Heart failure Father    Diabetes type II Father     SOCIAL HISTORY:  Social History   Socioeconomic History   Marital status: Divorced    Spouse name: Not on file   Number of children: Not on file   Years of education: Not on file   Highest education level: Not on file  Occupational History   Not on file  Tobacco Use   Smoking status: Former    Current packs/day: 0.50    Types: Cigarettes   Smokeless tobacco: Never  Substance and Sexual Activity   Alcohol use: Yes    Alcohol/week: 0.0 standard drinks of alcohol    Comment: occasional/fim   Drug use: No   Sexual activity: Not on file  Other Topics Concern   Not on file  Social History Narrative   Not on file   Social Drivers of Health   Financial Resource Strain: Not on file  Food Insecurity: Not on file  Transportation Needs: Not on file  Physical Activity: Not on file  Stress: Not on file  Social Connections: Unknown (12/16/2022)   Received from [employer]    How often do you feel lonely or isolated from those around you? (Adult - for ages 95 years and over): Not on file  Intimate Partner Violence: Not on file     PHYSICAL EXAM  Vitals:  02/26/23 1554  BP: (!) 149/64  Pulse: 93  Weight: 209 lb 8 oz (95 kg)  Height: 5\' 3"  (1.6 m)    Body mass index is 37.11 kg/m.   General: The patient is well-developed and well-nourished.  She is not agitated today. Neurologic Exam  Mental status: The  patient is alert and oriented x 3 at the time of the examination. The patient has apparent normal recent and remote memory, with an apparently normal attention span and concentration ability.   Speech is normal.  Cranial nerves: Extraocular movements are full.  Facial strength and sensation was normal.  Trapezius strength was normal.   No obvious hearing deficits are noted.  Motor:  Muscle bulk is normal.   She has mildly increased tone in the legs, R>L.. however, strength is  5 / 5 in arms and legs.  Sensory: She has intact sensation to touch in the arms and lower legs   Coordination: Cerebellar testing shows good finger-nose-finger.  Gait and station: Station is normal.   Gait is mildly wide.  Tandem gait is poor.   Romberg is negative..   Reflexes: Deep tendon reflexes are normal in the arms but mildly  increased in right leg.        Multiple sclerosis (HCC)  High risk medication use  Encounter for testing for latent tuberculosis  Vitamin D deficiency  Dysesthesia  Gait disturbance  Degeneration of intervertebral disc of lumbar region, unspecified whether pain present   1.   Continue Aubagio.     2.   If LBP or leg strength worsens or bladder/bowel consider lumbar MRI.    3.   Stay active and exercise as tolerated 4.   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   Mood is doing better on the current medications.  She will continue these.    5.    return in 6 months or sooner if there are new or worsening neurologic symptoms.    This visit is part of a comprehensive longitudinal care medical relationship regarding the patients primary diagnosis of MS and related concerns.   Neyda Durango A. Epimenio Foot, MD, PhD 02/26/2023, 4:18 PM Certified in Neurology, Clinical Neurophysiology, Sleep Medicine, Pain Medicine and Neuroimaging  [facility] [address] [place], [place] [address] [phone]

## 2023-02-27 LAB — COMPREHENSIVE METABOLIC PANEL
ALT: 16 [IU]/L (ref 0–32)
AST: 14 [IU]/L (ref 0–40)
Albumin: 3.9 g/dL (ref 3.9–4.9)
Alkaline Phosphatase: 76 [IU]/L (ref 44–121)
BUN/Creatinine Ratio: 25 (ref 12–28)
BUN: 15 mg/dL (ref 8–27)
Bilirubin Total: 0.2 mg/dL (ref 0.0–1.2)
CO2: 26 mmol/L (ref 20–29)
Calcium: 9.2 mg/dL (ref 8.7–10.3)
Chloride: 106 mmol/L (ref 96–106)
Creatinine, Ser: 0.61 mg/dL (ref 0.57–1.00)
Globulin, Total: 2.4 g/dL (ref 1.5–4.5)
Glucose: 128 mg/dL — ABNORMAL HIGH (ref 70–99)
Potassium: 4 mmol/L (ref 3.5–5.2)
Sodium: 143 mmol/L (ref 134–144)
Total Protein: 6.3 g/dL (ref 6.0–8.5)
eGFR: 99 mL/min/{1.73_m2} (ref >59)

## 2023-02-27 LAB — CBC WITH DIFFERENTIAL/PLATELET
Basophils Absolute: 0.1 10*3/uL (ref 0.0–0.2)
Basos: 1 %
EOS (ABSOLUTE): 0.2 10*3/uL (ref 0.0–0.4)
Eos: 2 %
Hematocrit: 41 % (ref 34.0–46.6)
Hemoglobin: 12.7 g/dL (ref 11.1–15.9)
Immature Grans (Abs): 0.1 10*3/uL (ref 0.0–0.1)
Immature Granulocytes: 1 %
Lymphocytes Absolute: 2.3 10*3/uL (ref 0.7–3.1)
Lymphs: 26 %
MCH: 27.1 pg (ref 26.6–33.0)
MCHC: 31 g/dL — ABNORMAL LOW (ref 31.5–35.7)
MCV: 88 fL (ref 79–97)
Monocytes Absolute: 0.8 10*3/uL (ref 0.1–0.9)
Monocytes: 9 %
Neutrophils Absolute: 5.5 10*3/uL (ref 1.4–7.0)
Neutrophils: 61 %
Platelets: 194 10*3/uL (ref 150–450)
RBC: 4.68 x10E6/uL (ref 3.77–5.28)
RDW: 14.1 % (ref 11.7–15.4)
WBC: 8.8 10*3/uL (ref 3.4–10.8)

## 2023-03-02 ENCOUNTER — Other Ambulatory Visit: Payer: Self-pay

## 2023-03-02 MED ORDER — LAMOTRIGINE 100 MG PO TABS: 100.0000 mg | ORAL_TABLET | Freq: Two times a day (BID) | ORAL | 1 refills | Status: DC

## 2023-03-26 ENCOUNTER — Other Ambulatory Visit: Payer: Self-pay | Admitting: Neurology

## 2023-03-26 ENCOUNTER — Other Ambulatory Visit: Payer: Self-pay

## 2023-03-26 DIAGNOSIS — N3281 Overactive bladder: Secondary | ICD-10-CM

## 2023-05-04 ENCOUNTER — Other Ambulatory Visit: Payer: Self-pay | Admitting: Neurology

## 2023-05-04 MED ORDER — AMANTADINE HCL 100 MG PO TABS: 100.0000 mg | ORAL_TABLET | Freq: Two times a day (BID) | ORAL | 1 refills | Status: DC

## 2023-05-04 NOTE — Telephone Encounter (Signed)
 Pt request refill for [facility]ine HCl [id number]

## 2023-05-04 NOTE — Telephone Encounter (Signed)
 Last seen on 02/26/23 Follow up scheduled on 09/08/23  Dr.Sater I just want to confirm pt is to continue Rx? Rx pending for your response. I didn't see it was mentioned in last visit note.

## 2023-05-13 ENCOUNTER — Telehealth: Payer: Self-pay

## 2023-05-13 ENCOUNTER — Telehealth: Payer: Self-pay | Admitting: Neurology

## 2023-05-13 ENCOUNTER — Other Ambulatory Visit (HOSPITAL_COMMUNITY): Payer: Self-pay

## 2023-05-13 NOTE — Telephone Encounter (Signed)
 Dr.Sater please see the below.

## 2023-05-13 NOTE — Telephone Encounter (Signed)
 Pt said Amantadine HCl 100 MG tablet has been denied because is not on the formulatory. Pharmacy provided temporary coverage of 30 pills. Have to change medication or request a exception. [employer]ax number for [phone], Phone: [phone]. Would like a call back.

## 2023-05-13 NOTE — Telephone Encounter (Signed)
 Can you help with this PA? Thank you

## 2023-05-13 NOTE — Telephone Encounter (Signed)
 Can you clarify which diagnosis you would like me to use for this PA for Amantadine. I see some notes about fatigue and also about gait so unsure which she is being prescribed this med for-Please advise-Thanks

## 2023-05-14 ENCOUNTER — Other Ambulatory Visit (HOSPITAL_COMMUNITY): Payer: Self-pay

## 2023-05-14 ENCOUNTER — Other Ambulatory Visit: Payer: Self-pay | Admitting: Family Medicine

## 2023-05-14 MED ORDER — DEXTROAMPHETAMINE SULFATE 10 MG PO TABS: 10.0000 mg | ORAL_TABLET | Freq: Three times a day (TID) | ORAL | 0 refills | Status: DC

## 2023-05-14 NOTE — Telephone Encounter (Signed)
 Last seen on 02/26/23 Follow up scheduled on 09/08/23 Last filled on 06/23/22 #270 (90 day supply)

## 2023-05-14 NOTE — Telephone Encounter (Signed)
 Pharmacy Patient Advocate Encounter   Received notification from Physician's Office that prior authorization for Amantadine HCl 100MG  tablets is required/requested.   Insurance verification completed.   The patient is insured through [facility] ADVANTAGE/RX ADVANCE .   Per test claim: PA required; PA submitted to above mentioned insurance via CoverMyMeds Key/confirmation #/EOC [id number] Status is pending

## 2023-05-15 NOTE — Telephone Encounter (Signed)
 Pharmacy Patient Advocate Encounter  Received notification from [facility] ADVANTAGE/RX ADVANCE that Prior Authorization for Amantadine HCl 100MG  tablets has been DENIED.  Full denial letter will be uploaded to the media tab. See denial reason below.   PA #/Case ID/Reference #: PA Case ID #: [id number]

## 2023-05-18 MED ORDER — AMANTADINE HCL 100 MG PO TABS: 100.0000 mg | ORAL_TABLET | Freq: Two times a day (BID) | ORAL | 1 refills | Status: DC

## 2023-05-18 NOTE — Telephone Encounter (Signed)
 Called pt. She was agreeable to fill amantadine through [facility] instead. I e-scribed rx. She fills MS DMT with them already and familiar on how to set up shipment. She will call if she has any trouble filling rx.    She also reports pinched nerve in L hip, centralized. Ortho doing PT for stenosis but tried to help w/ pinched nerve last week but ineffective. I placed on hold and spoke w/ Dr. Epimenio Foot. He recommends she f/u with Ortho. I relayed to pt and she is already established with Ortho and will f/u with them about this.

## 2023-06-22 ENCOUNTER — Telehealth: Payer: Self-pay | Admitting: Neurology

## 2023-06-22 NOTE — Telephone Encounter (Signed)
 Pt is asking for a call to discuss what she thinks may be a MS symptom.  Pt states that in the most severest of states for a about 2 weeks she has been dealing with a skin condition on her arm that starts with flat bumps or whelps all over her arms, the itching is not resolved by over the counter products, she is asking for a call to discuss, she is aware of her upcoming appointment.

## 2023-06-22 NOTE — Telephone Encounter (Signed)
 Dr. Godwin Lat- do you feel sx r/t to MS or should she f/u with dermatologist? She would like something for itching if possible as well. Interfering with sleep.   Pt last seen 02/26/23 and next f/u 09/08/23 w/ AL,NP. MS DMT: Aubagio .   I called pt. About 2 wk ago, started having severe itching on both arms. Started off with flat spots on arms. Scabbed over from her itching constantly. More spots developed after. Now has welts on her arms. Still taking Aubagio , been on this over a yr and has tolerated well so far. Has not changed soaps or detergents  Doing PT for spinal stenosis. Also placed on high dose ibuprofen 800mg  po TID.  She does not take TID, only when pain severe. She has PT this afternoon.

## 2023-06-22 NOTE — Telephone Encounter (Signed)
 Called and spoke w/ pt. Relayed Dr. Thom Fleeting recommendation. She verbalized understanding.

## 2023-07-02 DIAGNOSIS — M1612 Unilateral primary osteoarthritis, left hip: Secondary | ICD-10-CM | POA: Insufficient documentation

## 2023-07-14 ENCOUNTER — Other Ambulatory Visit: Payer: Self-pay | Admitting: *Deleted

## 2023-07-14 MED ORDER — DULOXETINE HCL 60 MG PO CPEP: ORAL_CAPSULE | ORAL | 0 refills | Status: DC

## 2023-07-14 NOTE — Telephone Encounter (Signed)
 Last seen on 02/16/23 Follow up scheduled on 09/08/23

## 2023-08-31 ENCOUNTER — Other Ambulatory Visit: Payer: Self-pay | Admitting: *Deleted

## 2023-08-31 MED ORDER — LAMOTRIGINE 100 MG PO TABS: 100.0000 mg | ORAL_TABLET | Freq: Two times a day (BID) | ORAL | 0 refills | Status: DC

## 2023-08-31 NOTE — Telephone Encounter (Signed)
 Last seen on 02/26/23 Follow up scheduled on 09/08/23

## 2023-09-07 NOTE — Progress Notes (Unsigned)
 No chief complaint on file.   HISTORY OF PRESENT ILLNESS:  09/07/23 ALL:  Jill Rogers is a 68 y.o. female here today for follow up for RRMS. She was last sene by Dr Vear 02/2023. She continues teriflunomide . MR brain stable 02/2022, cervical showed degenerative disease progression but no MS lesion in 06/2020.   She  She continues amantadine  100mg  BID.   Meloxicam  7.5mg  QD PRN for knee pain. She may take this once a week. It does help with knee, hip and neck pain. Lamotrigine  helps with dysesthesias.   She reports headaches are well managed. She has not needed indomethacin  in a while.   Mood is stable but she does not more depression. She lost her sister and feels her family has been divided since Covid. She continues duloxetine  60mg  daily. She usually takes clonazepam  0.5mg  PRN. She tries not to take this regularly. She takes hydroxyzine  25mg  at bedtime for muscle spasms (jerks) of arms and legs. She reports sleep is fairly stable. She does not sleep as well since her sister died in 11-25-2020.   She continues dextrostat  10mg  . Last filled for 90 days 07/23/2021. She reports taking 1 tablet in the mornings. She continues to have pretty significant brain fog. She is not able to exercise much. She walks her dog 1-2 times a day.   Oxybutynin  5mg  helps with urinary frequency. She feels that it has really helped a lot. She is no longer having incontinence.   Vitamin D ?   HISTORY (copied from Dr Duncan previous note)  Jill Rogers is a 68 y.o. female with relapsing remitting multiple sclerosis.      Update 08/27/2022 She stopped Tecfidera  (could not get free drug) last year and switched to teriflunomide ..   Generic dimethyl fumarate  was going to be too expensive for her.  She tried Vumerity but had a lot of itching and stopped.     MRI brain 03/11/2022 shows multiple T2/FLAIR hyperintense foci in the cerebral hemispheres, pons and cerebellum in a pattern consistent with chronic demyelinating  plaque associated with multiple sclerosis. None of the foci enhanced or appear to be acute. Compared to the MRI from 12/10/2019, there were no new lesions     The MRI of the cervical spine 06/23/2020 showed normal spinal cord.  She did have degenerative changes but nothing that would affect her leg strength.   She feels her gait is doing worse.  She uses a cane.   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  She has not had an MRI.      She is doing PT.   She feels her bowels are doing better.  She feels the right leg is weaker than left.        She feels her cognitive fog is worse.   She has felt very tired and poorly focused.     She was on dextroamphetamine  and then had trouble with cost so was placed on phentermine  for a while.  She felt the phentermine  did not help as much.   .      She feels mood is better since startig PT and noticing improved gait.  She is on duloxetine  and lamotrigine .   She is not seeing psychiatry x several years          MS History:   She was diagnosed in 29 after she presented with right hand weakness and a limp.   She had an MRI consistent  with MS and she did not need to have a LP.    Those symptoms improved after a few weeks.     She was initially placed on Symmetrel  for her fatigue and received some steroids (oral or IV) several times.    She was started on Avonex  in 1994.   She switched to Rebif  5 years ago after an exacerbation treated with IV solu-medrol .    Also during that time, her husband left and she was under a lot of stress.    She started seeing me around 2013.  Her last exacerbation was 2014 with some gait issues.   She tolerates Rebif  well but has had some bruising at times.       She switched to Vumerity in 2020.  Due to side effects and being stable for many years, DMT's were discontinued in 2021.   Insurance would not cover Tecfidera  and DMF was poorly tolerated.   She started Aubagio  generic 08/2022   IMAGING MRI 04/18/2019  showed T2/FLAIR hyperintense foci in the hemispheres and also a few foci in the pons and cerebellum in a pattern and configuration consistent with chronic demyelinating plaque associated with multiple sclerosis.  None of the foci enhance after contrast.  Compared to the MRI from 2016, there is no definite interim change.  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   MRI of the brain 12/10/2019 showed no new lesions.   MRI of the cervical spine 03/28/2016 showed a normal spinal cord and degenerative changes that could affect the C5 nerve roots at C4-C5 and the left C6 nerve root at C5-C6   MRI brain 03/11/2022 shows multiple T2/FLAIR hyperintense foci in the cerebral hemispheres, pons and cerebellum in a pattern consistent with chronic demyelinating plaque associated with multiple sclerosis. None of the foci enhanced or appear to be acute. Compared to the MRI from 12/10/2019, there were no new lesions        The MRI of the cervical spine 06/23/2020 showed normal spinal cord.  She did have degenerative changes but nothing that would affect her leg strength.    REVIEW OF SYSTEMS: Out of a complete 14 system review of symptoms, the patient complains only of the following symptoms, chronic pain, fatigue, anxiety, depression, falls, gait abnormality, insomnia, brain fog, and all other reviewed systems are negative.   ALLERGIES: Allergies  Allergen Reactions   Hydrocodone Nausea And Vomiting   Oxycodone  Nausea And Vomiting   Tape     bandaids rip her skin/fim   Vumerity [Diroximel Fumarate]     welts     HOME MEDICATIONS: Outpatient Medications Prior to Visit  Medication Sig Dispense Refill   alendronate (FOSAMAX) 70 MG tablet Take 70 mg by mouth once a week.     Amantadine  HCl 100 MG tablet Take 1 tablet (100 mg total) by mouth 2 (two) times daily. 180 tablet 1   atorvastatin (LIPITOR) 10 MG tablet Take 10 mg by mouth daily.  0   b complex  vitamins capsule Take 1 capsule by mouth daily.     Calcium Carbonate (CALCIUM 600 PO) Take 1 tablet by mouth daily.     Camphor-Menthol-Methyl Sal (SALONPAS EX) Apply 1 application topically as needed (for knee).     Cholecalciferol (VITAMIN D3 PO) Take 1,000 Units by mouth in the morning and at bedtime.     clonazePAM  (KLONOPIN ) 0.5 MG tablet Take 1 tablet (0.5 mg total) by mouth 2 (two) times daily as  needed for anxiety. 60 tablet 3   co-enzyme Q-10 30 MG capsule Take 200 mg by mouth daily.      dextroamphetamine  (DEXTROSTAT ) 10 MG tablet Take 1 tablet (10 mg total) by mouth 3 (three) times daily. Must call [phone] to schedule follow up for ongoing refills 270 tablet 0   diphenhydrAMINE (BENADRYL) 25 MG tablet Take 25 mg by mouth as needed for allergies.     DULoxetine  (CYMBALTA ) 60 MG capsule TAKE 1 CAPSULE BY MOUTH EVERY DAY 90 capsule 0   ELDERBERRY PO Take 1 Dose by mouth as needed. Takes 1tsp when needed (Patient not taking: Reported on 02/26/2023)     hydrochlorothiazide (HYDRODIURIL) 25 MG tablet Take 25 mg by mouth daily.     hydrOXYzine  (VISTARIL ) 25 MG capsule Take 1 capsule (25 mg total) by mouth every 8 (eight) hours as needed. 270 capsule 1   indomethacin  (INDOCIN ) 25 MG capsule One po tid prn headache 90 capsule 0   KRILL OIL/ASTAXANTHIN 1000 MG CAPS Take 500 mg by mouth 2 (two) times a day.      lamoTRIgine  (LAMICTAL ) 100 MG tablet Take 1 tablet (100 mg total) by mouth 2 (two) times daily. 180 tablet 0   meloxicam  (MOBIC ) 7.5 MG tablet TAKE 1 TABLET BY MOUTH EVERY DAY AS NEEDED KNEE PAIN 90 tablet 1   OVER THE COUNTER MEDICATION Take 1 Dose by mouth as needed. Hemp seed/metcha     oxybutynin  (DITROPAN ) 5 MG tablet TAKE 1 TABLET BY MOUTH TWICE A DAY 180 tablet 1   pantoprazole (PROTONIX) 40 MG tablet Take 40 mg by mouth daily.     Teriflunomide  14 MG TABS One po qd 90 tablet 3   No facility-administered medications prior to visit.     PAST MEDICAL HISTORY: Past Medical  History:  Diagnosis Date   Movement disorder    Multiple sclerosis (HCC)    Vision abnormalities      PAST SURGICAL HISTORY: Past Surgical History:  Procedure Laterality Date   arm surgery Right    fall 2023   CERVICAL CONE BIOPSY     CHOLECYSTECTOMY, LAPAROSCOPIC       FAMILY HISTORY: Family History  Problem Relation Age of Onset   Diabetes type II Mother    Arthritis Mother    Heart failure Father    Diabetes type II Father      SOCIAL HISTORY: Social History   Socioeconomic History   Marital status: Divorced    Spouse name: Not on file   Number of children: Not on file   Years of education: Not on file   Highest education level: Not on file  Occupational History   Not on file  Tobacco Use   Smoking status: Former    Current packs/day: 0.50    Types: Cigarettes   Smokeless tobacco: Never  Substance and Sexual Activity   Alcohol use: Yes    Alcohol/week: 0.0 standard drinks of alcohol    Comment: occasional/fim   Drug use: No   Sexual activity: Not on file  Other Topics Concern   Not on file  Social History Narrative   Not on file   Social Drivers of Health   Financial Resource Strain: Not on file  Food Insecurity: Not on file  Transportation Needs: Not on file  Physical Activity: Not on file  Stress: Not on file  Social Connections: Unknown (12/16/2022)   Received from [employer]    How often do you feel lonely  or isolated from those around you? (Adult - for ages 75 years and over): Not on file  Intimate Partner Violence: Not on file     PHYSICAL EXAM  There were no vitals filed for this visit.   There is no height or weight on file to calculate BMI.  Generalized: Well developed, in no acute distress  Cardiology: normal rate and rhythm, no murmur auscultated  Respiratory: clear to auscultation bilaterally    Neurological examination  Mentation: Alert oriented to time, place, history taking. Follows all commands  speech and language fluent Cranial nerve II-XII: Pupils were equal round reactive to light. Extraocular movements were full, visual field were full on confrontational test. Facial sensation and strength were normal. Uvula tongue midline. Head turning and shoulder shrug  were normal and symmetric. Motor: The motor testing reveals 5 over 5 strength of all 4 extremities.  Sensory: Sensory testing is intact to soft touch on all 4 extremities. No evidence of extinction is noted.  Coordination: Cerebellar testing reveals good finger-nose-finger and heel-to-shin bilaterally.  Gait and station: Gait is slightly wide Reflexes: Deep tendon reflexes are symmetric and normal bilaterally.    DIAGNOSTIC DATA (LABS, IMAGING, TESTING) - I reviewed patient records, labs, notes, testing and imaging myself where available.  Lab Results  Component Value Date   WBC 8.8 02/26/2023   HGB 12.7 02/26/2023   HCT 41.0 02/26/2023   MCV 88 02/26/2023   PLT 194 02/26/2023      Component Value Date/Time   NA 143 02/26/2023 1622   K 4.0 02/26/2023 1622   CL 106 02/26/2023 1622   CO2 26 02/26/2023 1622   GLUCOSE 128 (H) 02/26/2023 1622   BUN 15 02/26/2023 1622   CREATININE 0.61 02/26/2023 1622   CALCIUM 9.2 02/26/2023 1622   PROT 6.3 02/26/2023 1622   ALBUMIN 3.9 02/26/2023 1622   AST 14 02/26/2023 1622   ALT 16 02/26/2023 1622   ALKPHOS 76 02/26/2023 1622   BILITOT 0.2 02/26/2023 1622   GFRNONAA 92 01/26/2019 1217   GFRAA 106 01/26/2019 1217   No results found for: CHOL, HDL, LDLCALC, LDLDIRECT, TRIG, CHOLHDL No results found for: YHAJ8R No results found for: VITAMINB12 No results found for: TSH      No data to display               No data to display           ASSESSMENT AND PLAN  68 y.o. year old female  has a past medical history of Movement disorder, Multiple sclerosis (HCC), and Vision abnormalities. here with    No diagnosis found.  Tema reports having a  bad year with multiple falls and more depression. We have discussed nature of falls. She was encouraged to use her cane or walker at all times. I have offered PT but she declines at this time. We have discussed mood management. She stopped Abilify  as she was feeling well and she is not sure she wishes to resume. I have encouraged her to consider grief counseling. She will continue duloxetine  60mg  daily. She will also continue lamotrigine  amantadine ,meloxicam , clonazepam , hydroxyzine , and oxybutynin  as prescribed. May increase dextrostat  up to 10mg  TID. PDMP appropriate. She will call when refills of PRN meds needed. I will update MRI and labs. Fall precautions advised. Healthy lifestyle habits encouraged. She will follow up with Dr Vear in 6 months.    No orders of the defined types were placed in this encounter.     No  orders of the defined types were placed in this encounter.    Greig Forbes, MSN, FNP-C 09/07/2023, 4:12 PM  [facility] [address] [place], [place] [address] [phone]

## 2023-09-07 NOTE — Patient Instructions (Signed)
Below is our plan:  We will continue current treatment plan. We will update labs, today.   Please make sure you are staying well hydrated. I recommend 50-60 ounces daily. Well balanced diet and regular exercise encouraged. Consistent sleep schedule with 6-8 hours recommended.   Please continue follow up with care team as directed.   Follow up with Dr Sater in 6 months   You may receive a survey regarding today's visit. I encourage you to leave honest feed back as I do use this information to improve patient care. Thank you for seeing me today!    

## 2023-09-08 ENCOUNTER — Ambulatory Visit: Payer: PPO | Admitting: Family Medicine

## 2023-09-08 ENCOUNTER — Encounter: Payer: Self-pay | Admitting: Family Medicine

## 2023-09-08 VITALS — Ht 63.0 in | Wt 193.0 lb

## 2023-09-08 DIAGNOSIS — G35 Multiple sclerosis: Secondary | ICD-10-CM | POA: Diagnosis not present

## 2023-09-08 DIAGNOSIS — R208 Other disturbances of skin sensation: Secondary | ICD-10-CM

## 2023-09-08 DIAGNOSIS — N3281 Overactive bladder: Secondary | ICD-10-CM | POA: Diagnosis not present

## 2023-09-08 DIAGNOSIS — E559 Vitamin D deficiency, unspecified: Secondary | ICD-10-CM

## 2023-09-08 DIAGNOSIS — E785 Hyperlipidemia, unspecified: Secondary | ICD-10-CM | POA: Insufficient documentation

## 2023-09-08 DIAGNOSIS — Z79899 Other long term (current) drug therapy: Secondary | ICD-10-CM | POA: Diagnosis not present

## 2023-09-08 DIAGNOSIS — M51369 Other intervertebral disc degeneration, lumbar region without mention of lumbar back pain or lower extremity pain: Secondary | ICD-10-CM

## 2023-09-08 DIAGNOSIS — R269 Unspecified abnormalities of gait and mobility: Secondary | ICD-10-CM

## 2023-09-08 DIAGNOSIS — R2681 Unsteadiness on feet: Secondary | ICD-10-CM | POA: Insufficient documentation

## 2023-09-08 DIAGNOSIS — E039 Hypothyroidism, unspecified: Secondary | ICD-10-CM | POA: Insufficient documentation

## 2023-09-08 MED ORDER — OXYBUTYNIN CHLORIDE 5 MG PO TABS
5.0000 mg | ORAL_TABLET | Freq: Two times a day (BID) | ORAL | 3 refills | Status: AC
Start: 2023-09-08 — End: ?

## 2023-09-08 MED ORDER — DULOXETINE HCL 60 MG PO CPEP: ORAL_CAPSULE | ORAL | 3 refills | Status: DC

## 2023-09-08 MED ORDER — AMANTADINE HCL 100 MG PO TABS: 100.0000 mg | ORAL_TABLET | Freq: Two times a day (BID) | ORAL | 3 refills | Status: DC

## 2023-09-08 MED ORDER — LAMOTRIGINE 100 MG PO TABS: 100.0000 mg | ORAL_TABLET | Freq: Two times a day (BID) | ORAL | 3 refills | Status: AC

## 2023-09-09 ENCOUNTER — Ambulatory Visit: Payer: Self-pay | Admitting: Family Medicine

## 2023-09-09 LAB — CBC WITH DIFFERENTIAL/PLATELET
Basophils Absolute: 0.1 x10E3/uL (ref 0.0–0.2)
Basos: 1 %
EOS (ABSOLUTE): 0.2 x10E3/uL (ref 0.0–0.4)
Eos: 2 %
Hematocrit: 42.9 % (ref 34.0–46.6)
Hemoglobin: 13 g/dL (ref 11.1–15.9)
Immature Grans (Abs): 0 x10E3/uL (ref 0.0–0.1)
Immature Granulocytes: 0 %
Lymphocytes Absolute: 2.4 x10E3/uL (ref 0.7–3.1)
Lymphs: 30 %
MCH: 28 pg (ref 26.6–33.0)
MCHC: 30.3 g/dL — ABNORMAL LOW (ref 31.5–35.7)
MCV: 93 fL (ref 79–97)
Monocytes Absolute: 0.7 x10E3/uL (ref 0.1–0.9)
Monocytes: 8 %
Neutrophils Absolute: 4.8 x10E3/uL (ref 1.4–7.0)
Neutrophils: 59 %
Platelets: 184 x10E3/uL (ref 150–450)
RBC: 4.64 x10E6/uL (ref 3.77–5.28)
RDW: 14 % (ref 11.7–15.4)
WBC: 8.1 x10E3/uL (ref 3.4–10.8)

## 2023-09-09 LAB — COMPREHENSIVE METABOLIC PANEL WITH GFR
ALT: 16 IU/L (ref 0–32)
AST: 22 IU/L (ref 0–40)
Albumin: 4 g/dL (ref 3.9–4.9)
Alkaline Phosphatase: 68 IU/L (ref 44–121)
BUN/Creatinine Ratio: 26 (ref 12–28)
BUN: 16 mg/dL (ref 8–27)
Bilirubin Total: <0.2 mg/dL (ref 0.0–1.2)
CO2: 24 mmol/L (ref 20–29)
Calcium: 9.3 mg/dL (ref 8.7–10.3)
Chloride: 104 mmol/L (ref 96–106)
Creatinine, Ser: 0.62 mg/dL (ref 0.57–1.00)
Globulin, Total: 2.2 g/dL (ref 1.5–4.5)
Glucose: 148 mg/dL — ABNORMAL HIGH (ref 70–99)
Potassium: 3.8 mmol/L (ref 3.5–5.2)
Sodium: 141 mmol/L (ref 134–144)
Total Protein: 6.2 g/dL (ref 6.0–8.5)
eGFR: 98 mL/min/1.73 (ref >59)

## 2023-09-09 LAB — VITAMIN D 25 HYDROXY (VIT D DEFICIENCY, FRACTURES): Vit D, 25-Hydroxy: 89.7 ng/mL (ref 30.0–100.0)

## 2023-09-22 ENCOUNTER — Other Ambulatory Visit: Payer: Self-pay | Admitting: Family Medicine

## 2023-09-22 MED ORDER — CLONAZEPAM 0.5 MG PO TABS: 0.5000 mg | ORAL_TABLET | Freq: Two times a day (BID) | ORAL | 3 refills | Status: AC | PRN

## 2023-09-22 NOTE — Telephone Encounter (Signed)
 Last seen on 09/08/23 Follow up scheduled on 03/16/24   Dispensed Days Supply Quantity Provider Pharmacy  CLONAZEPAM  0.5 MG TABLET 02/26/2023 30 60 each Sater, Charlie LABOR, MD [facility] [phone] - [facility]...     Rx pending to be signed

## 2023-09-22 NOTE — Telephone Encounter (Signed)
 Pt is requesting a refill for clonazePAM  (KLONOPIN ) 0.5 MG tablet.  Pharmacy: [facility] [phone]

## 2023-10-20 ENCOUNTER — Telehealth: Payer: Self-pay | Admitting: Family Medicine

## 2023-10-20 MED ORDER — AMANTADINE HCL 100 MG PO TABS: 100.0000 mg | ORAL_TABLET | Freq: Two times a day (BID) | ORAL | 3 refills | Status: DC

## 2023-10-20 NOTE — Telephone Encounter (Signed)
E-scribed rx as requested.

## 2023-10-20 NOTE — Telephone Encounter (Signed)
 Pt called to request medication refill  Amantadine  HCl 100 MG tablet  Pt medication is to be sent to   [facility] - [place], [place] - [address] Phone: [phone]  Fax: [phone]

## 2024-02-08 ENCOUNTER — Telehealth: Payer: Self-pay | Admitting: Neurology

## 2024-02-08 NOTE — Telephone Encounter (Signed)
 Patient as been taking medications as directed and was doing well for months. Out of the [facility] she started having issues with being able to move her legs. Patient stated she had previous episodes back in November and it was last 3-20 minutes. She has had two episodes one before christmas were she was unable to walk for 4 hours. She tried to use the wall or cane to move but was having difficulties and the same thing happened to her after christmas.   - patient states her speech has been slow as well as her train of thought.   Please advise. I did advise patient if she has another episode to have someone take her to the emergency room just to be safe.

## 2024-02-08 NOTE — Telephone Encounter (Signed)
 Pt called to stating  she is having a MS Problem and that she needs to speak to Md . Pt stated she it having a hard time with no being able to  get up the patient stated it taking her really long just to stand and when she do get up she is in pain . Pt states when she sit down it almost take her an hour just to get up . Pt would ike to discuss with nurse  or MD  about concerns

## 2024-02-09 NOTE — Telephone Encounter (Signed)
 I called the patient and she is scheduled for 02/15/24 at 3:30 with Dr. Vear per his request to get her in next week for MS concerns.

## 2024-02-14 NOTE — Progress Notes (Unsigned)
 "  [facility]  PATIENT: Jill Rogers DOB: 1955/12/20  REFERRING DOCTOR OR PCP:  None SOURCE: patient  _________________________________   HISTORICAL  CHIEF COMPLAINT:  No chief complaint on file.   HISTORY OF PRESENT ILLNESS:  Cayle Cordoba is a 69 y.o. female with relapsing remitting multiple sclerosis.     Update 02/15/2024 She stopped Tecfidera  (could not get free drug) last year and switched to teriflunomide ..   Generic dimethyl fumarate  was going to be too expensive for her.  She tried Vumerity but had a lot of itching and stopped.    MRI brain 03/11/2022 shows multiple T2/FLAIR hyperintense foci in the cerebral hemispheres, pons and cerebellum in a pattern consistent with chronic demyelinating plaque associated with multiple sclerosis. None of the foci enhanced or appear to be acute. Compared to the MRI from 12/10/2019, there were no new lesions     The MRI of the cervical spine 06/23/2020 showed normal spinal cord.  She did have degenerative changes but nothing that would affect her leg strength.  She feels her gait is doing worse.  She uses a cane.   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  She has not had an MRI.      She is doing PT.   She feels her bowels are doing better.  She feels the right leg is weaker than left.       She feels her cognitive fog is worse.   She has felt very tired and poorly focused.     She was on dextroamphetamine  and then had trouble with cost so was placed on phentermine  for a while.  She felt the phentermine  did not help as much.   .      She feels mood is better since startig PT and noticing improved gait.  She is on duloxetine  and lamotrigine .   She is not seeing psychiatry x several years         MS History:   She was diagnosed in 64 after she presented with right hand weakness and a limp.   She had an MRI consistent with MS and she did not need to have a LP.    Those symptoms improved  after a few weeks.     She was initially placed on Symmetrel  for her fatigue and received some steroids (oral or IV) several times.    She was started on Avonex  in 1994.   She switched to Rebif  5 years ago after an exacerbation treated with IV solu-medrol .    Also during that time, her husband left and she was under a lot of stress.    She started seeing me around 2013.  Her last exacerbation was 2014 with some gait issues.   She tolerates Rebif  well but has had some bruising at times.       She switched to Vumerity in 2020.  Due to side effects and being stable for many years, DMT's were discontinued in 2021.   Insurance would not cover Tecfidera  and DMF was poorly tolerated.   She started Aubagio  generic 08/2022  IMAGING MRI 04/18/2019 showed T2/FLAIR hyperintense foci in the hemispheres and also a few foci in the pons and cerebellum in a pattern and configuration consistent with chronic demyelinating plaque associated with multiple sclerosis.  None of the foci enhance after contrast.  Compared to the MRI from 2016, there is no definite interim change.  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  MRI of the brain 12/10/2019 showed no new lesions.  MRI of the cervical spine 03/28/2016 showed a normal spinal cord and degenerative changes that could affect the C5 nerve roots at C4-C5 and the left C6 nerve root at C5-C6  MRI brain 03/11/2022 shows multiple T2/FLAIR hyperintense foci in the cerebral hemispheres, pons and cerebellum in a pattern consistent with chronic demyelinating plaque associated with multiple sclerosis. None of the foci enhanced or appear to be acute. Compared to the MRI from 12/10/2019, there were no new lesions       The MRI of the cervical spine 06/23/2020 showed normal spinal cord.  She did have degenerative changes but nothing that would affect her leg strength.   REVIEW OF SYSTEMS: Constitutional: No fevers, chills, sweats, or change  in appetite.   Fatigue.   She reports excessive sleepines Eyes: No visual changes.   Notes  double vision.  No eye pain Ear, nose and throat: No hearing loss, ear pain, nasal congestion, sore throat.  Recent ear infection Cardiovascular: No chest pain, palpitations Respiratory:  No shortness of breath at rest or with exertion.   No wheezes GastrointestinaI: No nausea, vomiting, diarrhea, abdominal pain, fecal incontinence Genitourinary: see above Musculoskeletal:  Mild neck pain, back pain.   Muscles stiff Integumentary: No rash, pruritus.   Has boils/abscesses Neurological: as above Psychiatric: see above Endocrine: No palpitations, diaphoresis, change in appetite, change in weigh or increased thirst Hematologic/Lymphatic:  No anemia, petechiae.   Bruises easily at injeciton sites Allergic/Immunologic: No itchy/runny eyes, nasal congestion, recent allergic reactions, rashes  ALLERGIES: Allergies  Allergen Reactions   Hydrocodone Nausea And Vomiting   Ibuprofen-Acetaminophen Itching   Oxycodone  Nausea And Vomiting   Tape     bandaids rip her skin/fim   Vumerity [Diroximel Fumarate]     welts    HOME MEDICATIONS:  Current Outpatient Medications:    Amantadine  HCl 100 MG tablet, Take 1 tablet (100 mg total) by mouth 2 (two) times daily., Disp: 180 tablet, Rfl: 3   atorvastatin (LIPITOR) 10 MG tablet, Take 10 mg by mouth daily., Disp: , Rfl: 0   b complex vitamins capsule, Take 1 capsule by mouth daily., Disp: , Rfl:    Calcium Carbonate (CALCIUM 600 PO), Take 1 tablet by mouth daily., Disp: , Rfl:    Cholecalciferol (VITAMIN D3 PO), Take 1,000 Units by mouth in the morning and at bedtime., Disp: , Rfl:    clonazePAM  (KLONOPIN ) 0.5 MG tablet, Take 1 tablet (0.5 mg total) by mouth 2 (two) times daily as needed for anxiety., Disp: 60 tablet, Rfl: 3   co-enzyme Q-10 30 MG capsule, Take 200 mg by mouth daily. , Disp: , Rfl:    dextroamphetamine  (DEXTROSTAT ) 10 MG tablet, Take 1 tablet  (10 mg total) by mouth 3 (three) times daily. Must call [phone] to schedule follow up for ongoing refills, Disp: 270 tablet, Rfl: 0   diphenhydrAMINE (BENADRYL) 25 MG tablet, Take 25 mg by mouth as needed for allergies., Disp: , Rfl:    DULoxetine  (CYMBALTA ) 60 MG capsule, TAKE 1 CAPSULE BY MOUTH EVERY DAY, Disp: 90 capsule, Rfl: 3   hydrochlorothiazide (HYDRODIURIL) 25 MG tablet, Take 25 mg by mouth daily., Disp: , Rfl:    hydrOXYzine  (VISTARIL ) 25 MG capsule, Take 1 capsule (25 mg total) by mouth every 8 (eight) hours as needed., Disp: 270 capsule, Rfl: 1   indomethacin  (INDOCIN ) 25 MG capsule, One po tid prn headache, Disp: 90  capsule, Rfl: 0   KRILL OIL/ASTAXANTHIN 1000 MG CAPS, Take 500 mg by mouth 2 (two) times a day. , Disp: , Rfl:    lamoTRIgine  (LAMICTAL ) 100 MG tablet, Take 1 tablet (100 mg total) by mouth 2 (two) times daily., Disp: 180 tablet, Rfl: 3   meloxicam  (MOBIC ) 7.5 MG tablet, TAKE 1 TABLET BY MOUTH EVERY DAY AS NEEDED KNEE PAIN, Disp: 90 tablet, Rfl: 1   oxybutynin  (DITROPAN ) 5 MG tablet, Take 1 tablet (5 mg total) by mouth 2 (two) times daily., Disp: 180 tablet, Rfl: 3   pantoprazole (PROTONIX) 40 MG tablet, Take 40 mg by mouth daily., Disp: , Rfl:    Teriflunomide  14 MG TABS, One po qd, Disp: 90 tablet, Rfl: 3  PAST MEDICAL HISTORY: Past Medical History:  Diagnosis Date   Movement disorder    Multiple sclerosis    Vision abnormalities     PAST SURGICAL HISTORY: Past Surgical History:  Procedure Laterality Date   arm surgery Right    fall 2023   CERVICAL CONE BIOPSY     CHOLECYSTECTOMY, LAPAROSCOPIC      FAMILY HISTORY: Family History  Problem Relation Age of Onset   Diabetes type II Mother    Arthritis Mother    Heart failure Father    Diabetes type II Father     SOCIAL HISTORY:  Social History   Socioeconomic History   Marital status: Divorced    Spouse name: Not on file   Number of children: Not on file   Years of education: Not on file    Highest education level: Not on file  Occupational History   Not on file  Tobacco Use   Smoking status: Former    Current packs/day: 0.50    Types: Cigarettes   Smokeless tobacco: Never  Substance and Sexual Activity   Alcohol use: Yes    Alcohol/week: 0.0 standard drinks of alcohol    Comment: occasional/fim   Drug use: No   Sexual activity: Not on file  Other Topics Concern   Not on file  Social History Narrative   Not on file   Social Drivers of Health   Tobacco Use: High Risk (01/01/2024)   Received from [facility]   Patient History    Smoking Tobacco Use: Every Day    Smokeless Tobacco Use: Never    Passive Exposure: Not on file  Financial Resource Strain: Not on file  Food Insecurity: Not on file  Transportation Needs: Not on file  Physical Activity: Not on file  Stress: Not on file  Social Connections: Unknown (12/16/2022)   Received from [facility]    How often do you feel lonely or isolated from those around you? (Adult - for ages 59 years and over): Not on file  Intimate Partner Violence: Not on file  Depression (EYV7-0): Not on file  Alcohol Screen: Not on file  Housing: Not on file  Utilities: Unknown (12/16/2022)   Received from [employer]    Do you have trouble paying your heating, water, or electric bill? (Adult - for ages 20 years and over): Not on file    Is your family able to pay the heat, water, or electric bill? (Household - for ages 0-17 years): Not on file    Does your family have access to good internet? (Household - for ages 0-17 years): Not on file  Health Literacy: Not on file     PHYSICAL EXAM  There were no vitals  filed for this visit.   There is no height or weight on file to calculate BMI.   General: The patient is well-developed and well-nourished.  She is not agitated today. Neurologic Exam  Mental status: The patient is alert and oriented x 3 at the time of the examination. The patient has  apparent normal recent and remote memory, with an apparently normal attention span and concentration ability.   Speech is normal.  Cranial nerves: Extraocular movements are full.  Facial strength and sensation was normal.  Trapezius strength was normal.   No obvious hearing deficits are noted.  Motor:  Muscle bulk is normal.   She has mildly increased tone in the legs, R>L.. however, strength is  5 / 5 in arms and legs.  Sensory: She has intact sensation to touch in the arms and lower legs   Coordination: Cerebellar testing shows good finger-nose-finger.  Gait and station: Station is normal.   Gait is mildly wide.  Tandem gait is poor.   Romberg is negative..   Reflexes: Deep tendon reflexes are normal in the arms but mildly  increased in right leg.        No diagnosis found.   1.   Continue Aubagio .     2.   If LBP or leg strength worsens or bladder/bowel consider lumbar MRI.    3.   Stay active and exercise as tolerated 4.   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   Mood is doing better on the current medications.  She will continue these.    5.    return in 6 months or sooner if there are new or worsening neurologic symptoms.    This visit is part of a comprehensive longitudinal care medical relationship regarding the patients primary diagnosis of MS and related concerns.   Alizaya Oshea A. Vear, MD, PhD 02/14/2024, 7:52 PM Certified in Neurology, Clinical Neurophysiology, Sleep Medicine, Pain Medicine and Neuroimaging  [facility] [address] [place], [place] [address] [phone]  "

## 2024-02-15 ENCOUNTER — Ambulatory Visit: Admitting: Neurology

## 2024-02-15 ENCOUNTER — Encounter: Payer: Self-pay | Admitting: Neurology

## 2024-02-15 VITALS — BP 139/84 | HR 90 | Resp 17 | Ht 63.0 in | Wt 185.0 lb

## 2024-02-15 DIAGNOSIS — F322 Major depressive disorder, single episode, severe without psychotic features: Secondary | ICD-10-CM

## 2024-02-15 DIAGNOSIS — R269 Unspecified abnormalities of gait and mobility: Secondary | ICD-10-CM | POA: Diagnosis not present

## 2024-02-15 DIAGNOSIS — N3281 Overactive bladder: Secondary | ICD-10-CM

## 2024-02-15 DIAGNOSIS — Z79899 Other long term (current) drug therapy: Secondary | ICD-10-CM | POA: Diagnosis not present

## 2024-02-15 DIAGNOSIS — G35A Relapsing-remitting multiple sclerosis: Secondary | ICD-10-CM | POA: Diagnosis not present

## 2024-02-15 DIAGNOSIS — R208 Other disturbances of skin sensation: Secondary | ICD-10-CM

## 2024-02-15 MED ORDER — TERIFLUNOMIDE 14 MG PO TABS: ORAL_TABLET | ORAL | 3 refills | Status: AC

## 2024-02-15 MED ORDER — AMANTADINE HCL 100 MG PO TABS: 100.0000 mg | ORAL_TABLET | Freq: Two times a day (BID) | ORAL | 3 refills | Status: AC

## 2024-02-15 MED ORDER — DEXTROAMPHETAMINE SULFATE 10 MG PO TABS: 10.0000 mg | ORAL_TABLET | Freq: Three times a day (TID) | ORAL | 0 refills | Status: DC

## 2024-02-15 MED ORDER — DULOXETINE HCL 60 MG PO CPEP: ORAL_CAPSULE | ORAL | 3 refills | Status: AC

## 2024-02-17 ENCOUNTER — Telehealth: Payer: Self-pay | Admitting: Neurology

## 2024-02-17 NOTE — Telephone Encounter (Signed)
MRI orders sent to [facility] [phone]

## 2024-03-02 ENCOUNTER — Other Ambulatory Visit: Payer: Self-pay | Admitting: Neurology

## 2024-03-02 MED ORDER — DEXTROAMPHETAMINE SULFATE 10 MG PO TABS: 10.0000 mg | ORAL_TABLET | Freq: Three times a day (TID) | ORAL | 0 refills | Status: AC

## 2024-03-02 NOTE — Telephone Encounter (Signed)
 Pt is asking her dextroamphetamine  (DEXTROSTAT ) 10 MG tablet be called into [facility] [phone]

## 2024-03-02 NOTE — Telephone Encounter (Signed)
 Requested Prescriptions   Pending Prescriptions Disp Refills   dextroamphetamine  (DEXTROSTAT ) 10 MG tablet 270 tablet 0    Sig: Take 1 tablet (10 mg total) by mouth 3 (three) times daily. Must call [phone] to schedule follow up for ongoing refills   Last seen 02/15/24 Next appt 10/18/24  Dispenses   Dispensed Days Supply Quantity Provider Pharmacy  DEXTROAMPHETAMINE  10 MG TAB 05/18/2023 90 270 each Sater, Charlie LABOR, MD [facility] [phone] - [email or web address]

## 2024-03-04 ENCOUNTER — Telehealth: Payer: Self-pay | Admitting: Neurology

## 2024-03-04 NOTE — Telephone Encounter (Signed)
 Pt called stating that she is needing an exception for her [employer] in order for her to be able to continue to receive her dextroamphetamine  (DEXTROSTAT ) 10 MG tablet due to the medication not being on their list. Please advise.

## 2024-03-09 ENCOUNTER — Telehealth: Payer: Self-pay | Admitting: Pharmacy Technician

## 2024-03-09 ENCOUNTER — Other Ambulatory Visit (HOSPITAL_COMMUNITY): Payer: Self-pay

## 2024-03-09 NOTE — Telephone Encounter (Signed)
 PA is not needed. Called pharmacy to see what happens on their end and realized that patient has a high copay >$500. Will call insurance for a tier exception.

## 2024-03-14 NOTE — Telephone Encounter (Signed)
 error

## 2024-03-15 ENCOUNTER — Encounter: Payer: Self-pay | Admitting: Neurology

## 2024-03-16 ENCOUNTER — Ambulatory Visit: Admitting: Neurology

## 2024-03-18 ENCOUNTER — Inpatient Hospital Stay: Admission: RE | Admit: 2024-03-18

## 2024-03-18 ENCOUNTER — Ambulatory Visit: Payer: Self-pay | Admitting: Neurology

## 2024-03-18 DIAGNOSIS — G35A Relapsing-remitting multiple sclerosis: Secondary | ICD-10-CM

## 2024-03-18 DIAGNOSIS — R269 Unspecified abnormalities of gait and mobility: Secondary | ICD-10-CM

## 2024-03-18 DIAGNOSIS — N3281 Overactive bladder: Secondary | ICD-10-CM

## 2024-03-18 MED ORDER — GADOPICLENOL 0.5 MMOL/ML IV SOLN
7.5000 mL | Freq: Once | INTRAVENOUS | Status: AC | PRN
  Administered 2024-03-18: 7.5 mL via INTRAVENOUS

## 2024-10-18 ENCOUNTER — Ambulatory Visit: Admitting: Neurology
